# Patient Record
Sex: Male | Born: 1990 | Race: Black or African American | Hispanic: No | Marital: Single | Smoking: Never smoker
Health system: Southern US, Community
[De-identification: ages and names within clinical notes are randomized; demographics above are authoritative.]

## PROBLEM LIST (undated history)

## (undated) DIAGNOSIS — Z21 Asymptomatic human immunodeficiency virus [HIV] infection status: Secondary | ICD-10-CM

## (undated) DIAGNOSIS — G02 Meningitis in other infectious and parasitic diseases classified elsewhere: Secondary | ICD-10-CM

## (undated) DIAGNOSIS — B20 Human immunodeficiency virus [HIV] disease: Secondary | ICD-10-CM

---

## 2018-05-03 DIAGNOSIS — G02 Meningitis in other infectious and parasitic diseases classified elsewhere: Secondary | ICD-10-CM

## 2018-05-03 HISTORY — DX: Meningitis in other infectious and parasitic diseases classified elsewhere: G02

## 2018-09-03 ENCOUNTER — Encounter: Payer: Self-pay | Admitting: Emergency Medicine

## 2018-09-03 ENCOUNTER — Other Ambulatory Visit: Payer: Self-pay

## 2018-09-03 ENCOUNTER — Observation Stay
Admission: EM | Admit: 2018-09-03 | Discharge: 2018-09-05 | Disposition: A | Discharge status: Home / Self Care | Payer: Self-pay | Attending: Internal Medicine | Admitting: Internal Medicine

## 2018-09-03 DIAGNOSIS — B20 Human immunodeficiency virus [HIV] disease: Secondary | ICD-10-CM | POA: Insufficient documentation

## 2018-09-03 DIAGNOSIS — Z79899 Other long term (current) drug therapy: Secondary | ICD-10-CM | POA: Insufficient documentation

## 2018-09-03 DIAGNOSIS — G02 Meningitis in other infectious and parasitic diseases classified elsewhere: Secondary | ICD-10-CM | POA: Insufficient documentation

## 2018-09-03 DIAGNOSIS — B028 Zoster with other complications: Principal | ICD-10-CM | POA: Insufficient documentation

## 2018-09-03 DIAGNOSIS — B029 Zoster without complications: Secondary | ICD-10-CM | POA: Diagnosis present

## 2018-09-03 HISTORY — DX: Meningitis in other infectious and parasitic diseases classified elsewhere: G02

## 2018-09-03 HISTORY — DX: Asymptomatic human immunodeficiency virus (hiv) infection status: Z21

## 2018-09-03 HISTORY — DX: Human immunodeficiency virus (HIV) disease: B20

## 2018-09-03 MED ORDER — SODIUM CHLORIDE 0.9 % IV SOLN: 5.0000 mg/kg | Freq: Two times a day (BID) | INTRAVENOUS | Status: DC

## 2018-09-03 NOTE — ED Provider Notes (Signed)
[facility] Emergency Department Provider Note  ____________________________________________   First MD Initiated Contact with Patient 09/03/18 2322     (approximate)  I have reviewed the triage vital signs and the nursing notes.   HISTORY  Chief Complaint Rash   HPI David Lopez is a 27 y.o. male who self presents to the emergency department with 3 days of progressive painful itching rash across his left flank.  It initially began along his anterior left abdomen and seems to radiate back towards his mid back on the left.  His symptoms are now moderate severity.  He describes it as itching tingling and burning.  It does seem to radiate back and forth along the dermatome.  He has had no fevers or chills.  No cough.  No shortness of breath.  No double vision or blurred vision.  He does have a past medical history of HIV and has had a AIDS in the past.  Nothing seems to make his symptoms better or worse and they are clearly progressive.    History reviewed. No pertinent past medical history.  Patient Active Problem List   Diagnosis Date Noted  . Zoster 09/04/2018    History reviewed. No pertinent surgical history.  Prior to Admission medications   Medication Sig Start Date End Date Taking? Authorizing Provider  BIKTARVY 50-200-25 MG TABS tablet Take 1 tablet by mouth daily. 08/31/18  Yes [provider]  fluconazole (DIFLUCAN) 200 MG tablet Take 800 mg by mouth daily. 08/13/18  Yes [provider]    Allergies Patient has no known allergies.  No family history on file.  Social History Social History   Tobacco Use  . Smoking status: Never Smoker  . Smokeless tobacco: Never Used  Substance Use Topics  . Alcohol use: Never    Frequency: Never  . Drug use: Not on file    Review of Systems Constitutional: No fever/chills Eyes: No visual changes. ENT: No sore throat. Cardiovascular: Denies chest pain. Respiratory: Denies  shortness of breath. Gastrointestinal: No abdominal pain.  No nausea, no vomiting.  No diarrhea.  No constipation. Genitourinary: Negative for dysuria. Musculoskeletal: Negative for back pain. Skin: Positive for rash. Neurological: Negative for headaches, focal weakness or numbness.   ____________________________________________   PHYSICAL EXAM:  VITAL SIGNS: ED Triage Vitals  Enc Vitals Group     BP 09/03/18 2313 (!) 155/94     Pulse Rate 09/03/18 2313 87     Resp 09/03/18 2313 18     Temp 09/03/18 2313 98.7 F (37.1 C)     Temp Source 09/03/18 2313 Oral     SpO2 09/03/18 2313 100 %     Weight 09/03/18 2314 175 lb (79.4 kg)     Height 09/03/18 2314 5\' 11"  (1.803 m)     Head Circumference --      Peak Flow --      Pain Score 09/03/18 2314 0     Pain Loc --      Pain Edu? --      Excl. in GC? --     Constitutional: Alert and oriented x4 nontoxic no diaphoresis speaks in full clear sentences Eyes: PERRL EOMI. Head: Atraumatic. Nose: No congestion/rhinnorhea. Mouth/Throat: No trismus Neck: No stridor.   Cardiovascular: Normal rate, regular rhythm. Grossly normal heart sounds.  Good peripheral circulation. Respiratory: Normal respiratory effort.  No retractions. Lungs CTAB and moving good air Gastrointestinal: Soft nontender Musculoskeletal: No lower extremity edema   Neurologic:  Normal speech  and language. No gross focal neurologic deficits are appreciated. Skin: Single dermatomal vesicular rash along his left flank roughly T8 see photos below Psychiatric: Mood and affect are normal. Speech and behavior are normal.        ____________________________________________   DIFFERENTIAL includes but not limited to  Shingles, disseminated zoster, HIV, AIDS, viremia ____________________________________________   LABS (all labs ordered are listed, but only abnormal results are displayed)  Labs Reviewed  COMPREHENSIVE METABOLIC PANEL - Abnormal; Notable for the  following components:      Result Value   Creatinine, Ser 1.33 (*)    Total Protein 8.8 (*)    All other components within normal limits  CBC WITH DIFFERENTIAL/PLATELET - Abnormal; Notable for the following components:   WBC 2.4 (*)    RBC 3.49 (*)    Hemoglobin 11.1 (*)    HCT 35.2 (*)    MCV 100.9 (*)    Neutro Abs 1.0 (*)    All other components within normal limits  HELPER T-LYMPH-CD4 ([facility] ONLY)    Lab work reviewed by me shows low white count suggestive of ARDS __________________________________________  EKG   ____________________________________________  RADIOLOGY   ____________________________________________   PROCEDURES  Procedure(s) performed: no  Procedures  Critical Care performed: no  ____________________________________________   INITIAL IMPRESSION / ASSESSMENT AND PLAN / ED COURSE  Pertinent labs & imaging results that were available during my care of the patient were reviewed by me and considered in my medical decision making (see chart for details).   As part of my medical decision making, I reviewed the following data within the electronic MEDICAL RECORD NUMBER History obtained from family if available, nursing notes, old chart and ekg, as well as notes from prior ED visits.  The patient comes to the emergency department with a rash that is consistent with shingles.  I initially was planning on treating the patient as an outpatient as this seems to be in 1 dermatome and he is not systemically ill however on further questioning he does have a previous history of HIV along with AIDS and has been intermittently to noncompliant with his antiretrovirals.  While he does not appear systemically ill at this time I am concerned that he could have diffuse viremia and that this could rapidly spread and believe he requires IV acyclovir and inpatient admission.  I will give him 6 mg of IV morphine 4 mg of IV Zofran and 50 mg of IV Benadryl for pain nausea and itching  now.  I discussed with the hospitalist who has graciously agreed to admit the patient to his service.      ____________________________________________   FINAL CLINICAL IMPRESSION(S) / ED DIAGNOSES  Final diagnoses:  Herpes zoster with complication  Symptomatic HIV infection (HCC)      NEW MEDICATIONS STARTED DURING THIS VISIT:  Current Discharge Medication List       Note:  This document was prepared using Dragon voice recognition software and may include unintentional dictation errors.     Merrily Brittle, MD 09/04/18 [phone]

## 2018-09-03 NOTE — ED Triage Notes (Signed)
Patient to ER for c/o itchy painful rash to left side that patient reports sometimes has burning sensation. Patient has vesicular rash to left mid abd that travels to left back.

## 2018-09-04 ENCOUNTER — Encounter: Payer: Self-pay | Admitting: Internal Medicine

## 2018-09-04 DIAGNOSIS — B029 Zoster without complications: Secondary | ICD-10-CM | POA: Diagnosis present

## 2018-09-04 LAB — COMPREHENSIVE METABOLIC PANEL
ALT: 17 U/L (ref 0–44)
ANION GAP: 7 (ref 5–15)
AST: 21 U/L (ref 15–41)
Albumin: 4.1 g/dL (ref 3.5–5.0)
Alkaline Phosphatase: 119 U/L (ref 38–126)
BILIRUBIN TOTAL: 1.1 mg/dL (ref 0.3–1.2)
BUN: 13 mg/dL (ref 6–20)
CALCIUM: 9.5 mg/dL (ref 8.9–10.3)
CO2: 28 mmol/L (ref 22–32)
CREATININE: 1.33 mg/dL — AB (ref 0.61–1.24)
Chloride: 103 mmol/L (ref 98–111)
Glucose, Bld: 99 mg/dL (ref 70–99)
Potassium: 3.5 mmol/L (ref 3.5–5.1)
Sodium: 138 mmol/L (ref 135–145)
TOTAL PROTEIN: 8.8 g/dL — AB (ref 6.5–8.1)

## 2018-09-04 LAB — CBC WITH DIFFERENTIAL/PLATELET
Abs Immature Granulocytes: 0.03 10*3/uL (ref 0.00–0.07)
BASOS ABS: 0 10*3/uL (ref 0.0–0.1)
Basophils Relative: 1 %
Eosinophils Absolute: 0 10*3/uL (ref 0.0–0.5)
Eosinophils Relative: 1 %
HEMATOCRIT: 35.2 % — AB (ref 39.0–52.0)
HEMOGLOBIN: 11.1 g/dL — AB (ref 13.0–17.0)
IMMATURE GRANULOCYTES: 1 %
LYMPHS ABS: 0.8 10*3/uL (ref 0.7–4.0)
LYMPHS PCT: 35 %
MCH: 31.8 pg (ref 26.0–34.0)
MCHC: 31.5 g/dL (ref 30.0–36.0)
MCV: 100.9 fL — AB (ref 80.0–100.0)
MONOS PCT: 20 %
Monocytes Absolute: 0.5 10*3/uL (ref 0.1–1.0)
NRBC: 0 % (ref 0.0–0.2)
Neutro Abs: 1 10*3/uL — ABNORMAL LOW (ref 1.7–7.7)
Neutrophils Relative %: 42 %
Platelets: 245 10*3/uL (ref 150–400)
RBC: 3.49 MIL/uL — ABNORMAL LOW (ref 4.22–5.81)
RDW: 12.5 % (ref 11.5–15.5)
WBC: 2.4 10*3/uL — ABNORMAL LOW (ref 4.0–10.5)

## 2018-09-04 LAB — HEMOGLOBIN A1C
Hgb A1c MFr Bld: 4.5 % — ABNORMAL LOW (ref 4.8–5.6)
Mean Plasma Glucose: 82.45 mg/dL

## 2018-09-04 LAB — TSH: TSH: 2.715 u[IU]/mL (ref 0.350–4.500)

## 2018-09-04 MED ORDER — ACETAMINOPHEN 325 MG PO TABS
650.0000 mg | ORAL_TABLET | Freq: Four times a day (QID) | ORAL | Status: DC | PRN
  Administered 2018-09-04: 650 mg via ORAL
  Filled 2018-09-04: qty 2

## 2018-09-04 MED ORDER — DIPHENHYDRAMINE HCL 25 MG PO CAPS
25.0000 mg | ORAL_CAPSULE | Freq: Three times a day (TID) | ORAL | Status: DC | PRN
  Administered 2018-09-04: 25 mg via ORAL
  Filled 2018-09-04: qty 1

## 2018-09-04 MED ORDER — MORPHINE SULFATE (PF) 4 MG/ML IV SOLN
6.0000 mg | Freq: Once | INTRAVENOUS | Status: AC
  Administered 2018-09-04: 6 mg via INTRAVENOUS
  Filled 2018-09-04: qty 2

## 2018-09-04 MED ORDER — ONDANSETRON HCL 4 MG PO TABS: 4.0000 mg | ORAL_TABLET | Freq: Four times a day (QID) | ORAL | Status: DC | PRN

## 2018-09-04 MED ORDER — ONDANSETRON HCL 4 MG/2ML IJ SOLN: 4.0000 mg | Freq: Four times a day (QID) | INTRAMUSCULAR | Status: DC | PRN

## 2018-09-04 MED ORDER — ACETAMINOPHEN 650 MG RE SUPP: 650.0000 mg | Freq: Four times a day (QID) | RECTAL | Status: DC | PRN

## 2018-09-04 MED ORDER — DOCUSATE SODIUM 100 MG PO CAPS
100.0000 mg | ORAL_CAPSULE | Freq: Two times a day (BID) | ORAL | Status: DC
  Filled 2018-09-04: qty 1

## 2018-09-04 MED ORDER — ONDANSETRON HCL 4 MG/2ML IJ SOLN
4.0000 mg | Freq: Once | INTRAMUSCULAR | Status: AC
  Administered 2018-09-04: 4 mg via INTRAVENOUS
  Filled 2018-09-04: qty 2

## 2018-09-04 MED ORDER — DIPHENHYDRAMINE HCL 50 MG/ML IJ SOLN
50.0000 mg | Freq: Once | INTRAMUSCULAR | Status: AC
  Administered 2018-09-04: 50 mg via INTRAVENOUS
  Filled 2018-09-04: qty 1

## 2018-09-04 MED ORDER — FLUCONAZOLE 100 MG PO TABS
800.0000 mg | ORAL_TABLET | Freq: Every day | ORAL | Status: DC
  Administered 2018-09-04 – 2018-09-05 (×2): 800 mg via ORAL
  Filled 2018-09-04 (×2): qty 8

## 2018-09-04 MED ORDER — BICTEGRAVIR-EMTRICITAB-TENOFOV 50-200-25 MG PO TABS
1.0000 | ORAL_TABLET | Freq: Every day | ORAL | Status: DC
  Administered 2018-09-04 – 2018-09-05 (×2): 1 via ORAL
  Filled 2018-09-04 (×2): qty 1

## 2018-09-04 MED ORDER — ENOXAPARIN SODIUM 40 MG/0.4ML ~~LOC~~ SOLN
40.0000 mg | SUBCUTANEOUS | Status: DC
  Filled 2018-09-04: qty 0.4

## 2018-09-04 MED ORDER — DEXTROSE 5 % IV SOLN
10.0000 mg/kg | Freq: Three times a day (TID) | INTRAVENOUS | Status: DC
  Administered 2018-09-04 (×3): 795 mg via INTRAVENOUS
  Filled 2018-09-04 (×7): qty 15.9

## 2018-09-04 MED ORDER — OXYCODONE HCL 5 MG PO TABS: 5.0000 mg | ORAL_TABLET | Freq: Four times a day (QID) | ORAL | Status: DC | PRN

## 2018-09-04 NOTE — ED Notes (Addendum)
Floor unable to get report at this time. Nurse will call back to get report.

## 2018-09-04 NOTE — ED Notes (Signed)
Per Richardson Dopp, RN. Report given. Protected report time until 0730. Will transport to floor once able.

## 2018-09-04 NOTE — Plan of Care (Signed)
  Problem: Education: Goal: Knowledge of General Education information will improve Description Including pain rating scale, medication(s)/side effects and non-pharmacologic comfort measures Outcome: Progressing   Problem: Health Behavior/Discharge Planning: Goal: Ability to manage health-related needs will improve Outcome: Progressing   Problem: Clinical Measurements: Goal: Ability to maintain clinical measurements within normal limits will improve Outcome: Progressing Goal: Will remain free from infection Outcome: Progressing   Problem: Activity: Goal: Risk for activity intolerance will decrease Outcome: Progressing   Problem: Nutrition: Goal: Adequate nutrition will be maintained Outcome: Progressing   Problem: Coping: Goal: Level of anxiety will decrease Outcome: Progressing   Problem: Elimination: Goal: Will not experience complications related to bowel motility Outcome: Progressing   Problem: Pain Managment: Goal: General experience of comfort will improve Outcome: Progressing   Problem: Safety: Goal: Ability to remain free from injury will improve Outcome: Progressing   Problem: Skin Integrity: Goal: Risk for impaired skin integrity will decrease Outcome: Progressing   

## 2018-09-04 NOTE — ED Notes (Signed)
Floor notified of transport of patient to room. Onalee Hua [employer] fundraiser to transport. Report given by Richardson Dopp RN per shift report.

## 2018-09-04 NOTE — Progress Notes (Signed)
[employer] - [place] at [facility]                                                                                                                                                                                  Patient Demographics   David Lopez, is a 27 y.o. male, DOB - 1991-07-08, MVH:[id number]  Admit date - 09/03/2018   Admitting Physician Arnaldo Natal, MD  Outpatient Primary MD for the patient is Patient, No Pcp Per   LOS - 0  Subjective: Patient admitted with painful rash Appears to be herpetic rash in nature His pain is improved currently Review of Systems:   CONSTITUTIONAL: No documented fever. No fatigue, weakness. No weight gain, no weight loss.  EYES: No blurry or double vision.  ENT: No tinnitus. No postnasal drip. No redness of the oropharynx.  RESPIRATORY: No cough, no wheeze, no hemoptysis. No dyspnea.  CARDIOVASCULAR: No chest pain. No orthopnea. No palpitations. No syncope.  GASTROINTESTINAL: No nausea, no vomiting or diarrhea. No abdominal pain. No melena or hematochezia.  GENITOURINARY: No dysuria or hematuria.  ENDOCRINE: No polyuria or nocturia. No heat or cold intolerance.  HEMATOLOGY: No anemia. No bruising. No bleeding.  INTEGUMENTARY: Positive rashes. No lesions.  MUSCULOSKELETAL: No arthritis. No swelling. No gout.  NEUROLOGIC: No numbness, tingling, or ataxia. No seizure-type activity.  PSYCHIATRIC: No anxiety. No insomnia. No ADD.    Vitals:   Vitals:   09/04/18 0107 09/04/18 0130 09/04/18 0846 09/04/18 0939  BP: (!) 155/108 (!) 136/95 (!) 136/96   Pulse: 80 73 81   Resp: 18 18 20    Temp:   98.6 F (37 C)   TempSrc:   Oral   SpO2: 98% 98% (!) 88%   Weight:    62.2 kg  Height:    5\' 11"  (1.803 m)    Wt Readings from Last 3 Encounters:  09/04/18 62.2 kg     Intake/Output Summary (Last 24 hours) at 09/04/2018 1256 Last data filed at 09/04/2018 0409 Gross per 24 hour  Intake 181.53 ml  Output -  Net 181.53 ml     Physical Exam:   GENERAL: Pleasant-appearing in no apparent distress.  HEAD, EYES, EARS, NOSE AND THROAT: Atraumatic, normocephalic. Extraocular muscles are intact. Pupils equal and reactive to light. Sclerae anicteric. No conjunctival injection. No oro-pharyngeal erythema.  NECK: Supple. There is no jugular venous distention. No bruits, no lymphadenopathy, no thyromegaly.  HEART: Regular rate and rhythm,. No murmurs, no rubs, no clicks.  LUNGS: Clear to auscultation bilaterally. No rales or rhonchi. No wheezes.  ABDOMEN: Soft, flat, nontender, nondistended. Has good bowel sounds. No hepatosplenomegaly appreciated.  EXTREMITIES: No evidence of any  cyanosis, clubbing, or peripheral edema.  +2 pedal and radial pulses bilaterally.  NEUROLOGIC: The patient is alert, awake, and oriented x3 with no focal motor or sensory deficits appreciated bilaterally.  SKIN: Positive right thoracic and his flank and abdominal region.  Psych: Not anxious, depressed LN: No inguinal LN enlargement    Antibiotics   Anti-infectives (From admission, onward)   Start     Dose/Rate Route Frequency Ordered Stop   09/04/18 1000  bictegravir-emtricitabine-tenofovir AF (BIKTARVY) 50-200-25 MG per tablet 1 tablet     1 tablet Oral Daily 09/04/18 0824     09/04/18 1000  fluconazole (DIFLUCAN) tablet 800 mg     800 mg Oral Daily 09/04/18 0824     09/04/18 0015  acyclovir (ZOVIRAX) 795 mg in dextrose 5 % 150 mL IVPB     10 mg/kg  79.4 kg 165.9 mL/hr over 60 Minutes Intravenous Every 8 hours 09/04/18 0000     09/03/18 2345  ganciclovir (CYTOVENE) 395 mg in sodium chloride 0.9 % 100 mL IVPB  Status:  Discontinued     5 mg/kg  79.4 kg 100 mL/hr over 60 Minutes Intravenous Every 12 hours 09/03/18 2335 09/03/18 2358      Medications   Scheduled Meds: . bictegravir-emtricitabine-tenofovir AF  1 tablet Oral Daily  . docusate sodium  100 mg Oral BID  . enoxaparin (LOVENOX) injection  40 mg Subcutaneous Q24H  .  fluconazole  800 mg Oral Daily   Continuous Infusions: . acyclovir Stopped (09/04/18 0409)   PRN Meds:.acetaminophen **OR** acetaminophen, ondansetron **OR** ondansetron (ZOFRAN) IV, oxyCODONE   Data Review:   Micro Results No results found for this or any previous visit (from the past 240 hour(s)).  Radiology Reports No results found.   CBC Recent Labs  Lab 09/04/18 0025  WBC 2.4*  HGB 11.1*  HCT 35.2*  PLT 245  MCV 100.9*  MCH 31.8  MCHC 31.5  RDW 12.5  LYMPHSABS 0.8  MONOABS 0.5  EOSABS 0.0  BASOSABS 0.0    Chemistries  Recent Labs  Lab 09/04/18 0025  NA 138  K 3.5  CL 103  CO2 28  GLUCOSE 99  BUN 13  CREATININE 1.33*  CALCIUM 9.5  AST 21  ALT 17  ALKPHOS 119  BILITOT 1.1   ------------------------------------------------------------------------------------------------------------------ estimated creatinine clearance is 74.7 mL/min (A) (by C-G formula based on SCr of 1.33 mg/dL (H)). ------------------------------------------------------------------------------------------------------------------ No results for input(s): HGBA1C in the last 72 hours. ------------------------------------------------------------------------------------------------------------------ No results for input(s): CHOL, HDL, LDLCALC, TRIG, CHOLHDL, LDLDIRECT in the last 72 hours. ------------------------------------------------------------------------------------------------------------------ Recent Labs    09/04/18 0025  TSH 2.715   ------------------------------------------------------------------------------------------------------------------ No results for input(s): VITAMINB12, FOLATE, FERRITIN, TIBC, IRON, RETICCTPCT in the last 72 hours.  Coagulation profile No results for input(s): INR, PROTIME in the last 168 hours.  No results for input(s): DDIMER in the last 72 hours.  Cardiac Enzymes No results for input(s): CKMB, TROPONINI, MYOGLOBIN in the last 168  hours.  Invalid input(s): CK ------------------------------------------------------------------------------------------------------------------ Invalid input(s): POCBNP    Assessment & Plan   This is a 27 year old male admitted for herpes zoster. 1.  Zoster: Painful.    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  HIV: Last CD4 count 11; recheck immune status.  He reports compliance with his meds.  Continue ART (bitcegravir, emtricitabine and tenofovir).  Patient will need to follow-up with his primary HIV  MD he states that he sees him every 3 months earlier appointment will be needed 4.  DVT prophylaxis: Lovenox 5.  GI prophylaxis: None     Code Status Orders  (From admission, onward)         Start     Ordered   09/04/18 0825  Full code  Continuous     09/04/18 0824        Code Status History    This patient has a current code status but no historical code status.           Consults none  DVT Prophylaxis  Lovenox  Lab Results  Component Value Date   PLT 245 09/04/2018     Time Spent in minutes   45 minutes from 11:40 AM to 12:25 PM  Greater than 50% of time spent in care coordination and counseling patient regarding the condition and plan of care.   Auburn Bilberry M.D on 09/04/2018 at 12:56 PM  Between 7am to 6pm - Pager - [phone]  After 6pm go to [email or web address] - Social research officer, government  [employer]   Office  [phone]

## 2018-09-04 NOTE — H&P (Signed)
David Lopez is an 27 y.o. male.   Chief Complaint: Rash HPI: The patient with past medical history of HIV/AIDS and recent hospitalization for fungal meningitis presents to the emergency department with a painful rash.  The patient states that he first noticed the rash 5 days ago but that it has become tender.  Now he states that it feels as if it is burning at times.  He denies fever, nausea, vomiting or diarrhea.  The rash began on his stomach and has worked its way around his left chest onto his back.  The patient was placed on droplet isolation and given acyclovir prior to the emergency department staff calling the hospitalist service for admission.  The patient began taking antiretroviral therapy in April 2019.  His mother does not know about his past medical history.  Past Medical History:  Diagnosis Date  . HIV (human immunodeficiency virus infection) ([place])   . 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 No pertinent surgical history. None  Family History  Problem Relation Age of Onset  . Diabetes Mellitus II Paternal Grandmother   . CAD Neg Hx    Social History:  reports that he has never smoked. He has never used smokeless tobacco. He reports that he does not drink alcohol. His drug history is not on file.  Allergies: No Known Allergies  Medications Prior to Admission  Medication Sig Dispense Refill  . BIKTARVY 50-200-25 MG TABS tablet Take 1 tablet by mouth daily.  5  . fluconazole (DIFLUCAN) 200 MG tablet Take 800 mg by mouth daily.  0    Results for orders placed or performed during the hospital encounter of 09/03/18 (from the past 48 hour(s))  Comprehensive metabolic panel     Status: Abnormal   Collection Time: 09/04/18 12:25 AM  Result Value Ref Range   Sodium 138 135 - 145 mmol/L   Potassium 3.5 3.5 - 5.1 mmol/L   Chloride 103 98 - 111 mmol/L   CO2 28 22 - 32 mmol/L   Glucose, Bld 99 70 - 99 mg/dL   BUN 13 6 - 20 mg/dL   Creatinine, Ser 1.33  (H) 0.61 - 1.24 mg/dL   Calcium 9.5 8.9 - 10.3 mg/dL   Total Protein 8.8 (H) 6.5 - 8.1 g/dL   Albumin 4.1 3.5 - 5.0 g/dL   AST 21 15 - 41 U/L   ALT 17 0 - 44 U/L   Alkaline Phosphatase 119 38 - 126 U/L   Total Bilirubin 1.1 0.3 - 1.2 mg/dL   GFR calc non Af Amer >60 >60 mL/min   GFR calc Af Amer >60 >60 mL/min    Comment: (NOTE) The eGFR has been calculated using the CKD EPI equation. This calculation has not been validated in all clinical situations. eGFR's persistently <60 mL/min signify possible Chronic Kidney Disease.    Anion gap 7 5 - 15    Comment: Performed at [facility], [address] [place], [place] [address]  CBC with Differential     Status: Abnormal   Collection Time: 09/04/18 12:25 AM  Result Value Ref Range   WBC 2.4 (L) 4.0 - 10.5 K/uL   RBC 3.49 (L) 4.22 - 5.81 MIL/uL   Hemoglobin 11.1 (L) 13.0 - 17.0 g/dL   HCT 35.2 (L) 39.0 - 52.0 %   MCV 100.9 (H) 80.0 - 100.0 fL   MCH 31.8 26.0 - 34.0 pg   MCHC 31.5 30.0 - 36.0 g/dL   RDW  12.5 11.5 - 15.5 %   Platelets 245 150 - 400 K/uL   nRBC 0.0 0.0 - 0.2 %   Neutrophils Relative % 42 %   Neutro Abs 1.0 (L) 1.7 - 7.7 K/uL   Lymphocytes Relative 35 %   Lymphs Abs 0.8 0.7 - 4.0 K/uL   Monocytes Relative 20 %   Monocytes Absolute 0.5 0.1 - 1.0 K/uL   Eosinophils Relative 1 %   Eosinophils Absolute 0.0 0.0 - 0.5 K/uL   Basophils Relative 1 %   Basophils Absolute 0.0 0.0 - 0.1 K/uL   Immature Granulocytes 1 %   Abs Immature Granulocytes 0.03 0.00 - 0.07 K/uL    Comment: Performed at [facility], [address] [place], [place] [address]   No results found.  Review of Systems  Constitutional: Negative for chills and fever.  HENT: Negative for sore throat and tinnitus.   Eyes: Negative for blurred vision and redness.  Respiratory: Negative for cough and shortness of breath.   Cardiovascular: Negative for chest pain, palpitations, orthopnea and PND.  Gastrointestinal: Negative for  abdominal pain, diarrhea, nausea and vomiting.  Genitourinary: Negative for dysuria, frequency and urgency.  Musculoskeletal: Negative for joint pain and myalgias.  Skin: Negative for rash.       No lesions  Neurological: Negative for speech change, focal weakness and weakness.  Endo/Heme/Allergies: Does not bruise/bleed easily.       No temperature intolerance  Psychiatric/Behavioral: Negative for depression and suicidal ideas.    Blood pressure (!) 136/95, pulse 73, temperature 98.7 F (37.1 C), temperature source Oral, resp. rate 18, height 5' 11"  (1.803 m), weight 79.4 kg, SpO2 98 %. Physical Exam  Vitals reviewed. Constitutional: He is oriented to person, place, and time. He appears well-developed and well-nourished. No distress.  HENT:  Head: Normocephalic and atraumatic.  Mouth/Throat: Oropharynx is clear and moist.  Eyes: Pupils are equal, round, and reactive to light. Conjunctivae and EOM are normal. No scleral icterus.  Neck: Normal range of motion. Neck supple. No JVD present. No tracheal deviation present. No thyromegaly present.  Cardiovascular: Normal rate, regular rhythm and normal heart sounds. Exam reveals no gallop and no friction rub.  No murmur heard. Respiratory: Effort normal and breath sounds normal. No respiratory distress. He has no wheezes.  GI: Soft. Bowel sounds are normal. He exhibits no distension. There is no tenderness.  Genitourinary:  Genitourinary Comments: Deferred  Musculoskeletal: Normal range of motion. He exhibits no edema.  Lymphadenopathy:    He has no cervical adenopathy.  Neurological: He is alert and oriented to person, place, and time. No cranial nerve deficit.  Skin: Skin is warm and dry. Rash noted. There is erythema.  Psychiatric: He has a normal mood and affect. His behavior is normal. Judgment and thought content normal.     Assessment/Plan This is a 27 year old male admitted for herpes zoster. 1.  Zoster: Painful.  Manage severe  pain with IV morphine as needed.  Continue acyclovir.  Transition to oral anti-herpetic medication when it is clear the patient does not have disseminated disease.  At this time he does not have neurologic deficits nor does he have fever or rash outside of the dermatome of his left chest.  No evidence of immune reconstitution syndrome at this time. 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  HIV: Last CD4 count 11; recheck immune status.  He reports compliance with his meds.  Continue ART (bitcegravir, emtricitabine and tenofovir). 4.  DVT  prophylaxis: Lovenox 5.  GI prophylaxis: None The patient is a full code.  Time spent on admission orders and patient care approximately 45 minutes  Harrie Foreman, MD 09/04/2018, 8:15 AM

## 2018-09-04 NOTE — ED Notes (Addendum)
Floor stated that okay to send patient after shift change at 0730.

## 2018-09-05 LAB — BASIC METABOLIC PANEL
Anion gap: 7 (ref 5–15)
BUN: 15 mg/dL (ref 6–20)
CHLORIDE: 103 mmol/L (ref 98–111)
CO2: 26 mmol/L (ref 22–32)
CREATININE: 1.41 mg/dL — AB (ref 0.61–1.24)
Calcium: 9.5 mg/dL (ref 8.9–10.3)
GFR calc Af Amer: >60 mL/min (ref >60)
GFR calc non Af Amer: >60 mL/min (ref >60)
GLUCOSE: 92 mg/dL (ref 70–99)
POTASSIUM: 4.2 mmol/L (ref 3.5–5.1)
SODIUM: 136 mmol/L (ref 135–145)

## 2018-09-05 LAB — CBC
HEMATOCRIT: 33.4 % — AB (ref 39.0–52.0)
HEMOGLOBIN: 10.6 g/dL — AB (ref 13.0–17.0)
MCH: 31.9 pg (ref 26.0–34.0)
MCHC: 31.7 g/dL (ref 30.0–36.0)
MCV: 100.6 fL — AB (ref 80.0–100.0)
Platelets: 215 10*3/uL (ref 150–400)
RBC: 3.32 MIL/uL — AB (ref 4.22–5.81)
RDW: 12.5 % (ref 11.5–15.5)
WBC: 3.1 10*3/uL — ABNORMAL LOW (ref 4.0–10.5)
nRBC: 0 % (ref 0.0–0.2)

## 2018-09-05 MED ORDER — ACETAMINOPHEN 325 MG PO TABS: 650.0000 mg | ORAL_TABLET | Freq: Four times a day (QID) | ORAL | Status: DC | PRN

## 2018-09-05 MED ORDER — ACYCLOVIR 400 MG PO TABS: 800.0000 mg | ORAL_TABLET | Freq: Every day | ORAL | 0 refills | Status: AC

## 2018-09-05 MED ORDER — SODIUM CHLORIDE 0.9 % IV SOLN: INTRAVENOUS | Status: DC | PRN

## 2018-09-05 NOTE — Progress Notes (Signed)
[employer] - [place] at [facility] was admitted to the Hospital on 09/03/2018 and Discharged  09/05/2018 and should be excused from work/school   for 5 days starting 09/03/2018 , may return to work/school without any restrictions.  Call Auburn Bilberry MD with questions.  Auburn Bilberry M.D on 09/05/2018,at 10:52 AM  [employer] - [place] at [facility]  [phone]

## 2018-09-05 NOTE — Progress Notes (Signed)
Patient is being discharged to home with family. DC & Rx instructions given and patient acknowledged understanding. IV removed

## 2018-09-05 NOTE — Discharge Summary (Signed)
[facility] -  at [facility], [place] y.o., DOB 10/28/1991, MRN [id number]. Admission date: 09/03/2018 Discharge Date 09/05/2018 Primary MD Patient, No Pcp Per Admitting Physician Arnaldo Natal, MD  Admission Diagnosis  Symptomatic HIV infection (HCC) [B20] Herpes zoster with complication [B02.8]  Discharge Diagnosis   Active Problems: T10 dermatome herpes zoster Recent diagnosis of fungal meningitis HIV     Hospital Course  Patient is 27 year old who is [place] who was recently hospitalized with fungal meningitis who presented with a rash on his abdominal and back area.  He was noted to have herpetic zoster lesion.  You are you due to his HIV there was concern for may be dissemination therefore he was admitted for further evaluation and treatment.  Patient was treated with IV acyclovir.  There was no evidence of dissemination he is doing much better patient's pain is now resolving stable for discharge.  He will need to follow-up with ID physician as outpatient.           Consults  None  Significant Tests:  See full reports for all details     No results found.     Today   Subjective:   David Lopez patient doing better denies any complaints Objective:   Blood pressure (!) 136/96, pulse 81, temperature (!) 101.4 F (38.6 C), temperature source Oral, resp. rate 20, height 5\' 11"  (1.803 m), weight 62.4 kg, SpO2 (!) 88 %.  .  Intake/Output Summary (Last 24 hours) at 09/05/2018 1202 Last data filed at 09/05/2018 0500 Gross per 24 hour  Intake 332 ml  Output -  Net 332 ml    Exam VITAL SIGNS: Blood pressure (!) 136/96, pulse 81, temperature (!) 101.4 F (38.6 C), temperature source Oral, resp. rate 20, height 5\' 11"  (1.803 m), weight 62.4 kg, SpO2 (!) 88 %.  GENERAL:  27 y.o.-year-old patient lying in the bed with no acute distress.  EYES: Pupils equal, round, reactive to light and  accommodation. No scleral icterus. Extraocular muscles intact.  HEENT: Head atraumatic, normocephalic. Oropharynx and nasopharynx clear.  NECK:  Supple, no jugular venous distention. No thyroid enlargement, no tenderness.  LUNGS: Normal breath sounds bilaterally, no wheezing, rales,rhonchi or crepitation. No use of accessory muscles of respiration.  CARDIOVASCULAR: S1, S2 normal. No murmurs, rubs, or gallops.  ABDOMEN: Soft, nontender, nondistended. Bowel sounds present. No organomegaly or mass.  EXTREMITIES: No pedal edema, cyanosis, or clubbing.  NEUROLOGIC: Cranial nerves II through XII are intact. Muscle strength 5/5 in all extremities. Sensation intact. Gait not checked.  PSYCHIATRIC: The patient is alert and oriented x 3.  SKIN: T10 distribution rash consistent with zoster  Data Review     CBC w Diff:  Lab Results  Component Value Date   WBC 3.1 (L) 09/05/2018   HGB 10.6 (L) 09/05/2018   HCT 33.4 (L) 09/05/2018   PLT 215 09/05/2018   LYMPHOPCT 35 09/04/2018   MONOPCT 20 09/04/2018   EOSPCT 1 09/04/2018   BASOPCT 1 09/04/2018   CMP:  Lab Results  Component Value Date   NA 136 09/05/2018   K 4.2 09/05/2018   CL 103 09/05/2018   CO2 26 09/05/2018   BUN 15 09/05/2018   CREATININE 1.41 (H) 09/05/2018   PROT 8.8 (H) 09/04/2018   ALBUMIN 4.1 09/04/2018   BILITOT 1.1 09/04/2018   ALKPHOS 119 09/04/2018   AST 21 09/04/2018   ALT 17 09/04/2018  .  Micro Results  No results found for this or any previous visit (from the past 240 hour(s)).      Code Status Orders  (From admission, onward)         Start     Ordered   09/04/18 0825  Full code  Continuous     09/04/18 0824        Code Status History    This patient has a current code status but no historical code status.            Discharge Medications   Allergies as of 09/05/2018   No Known Allergies     Medication List    TAKE these medications   acetaminophen 325 MG tablet Commonly known as:   TYLENOL Take 2 tablets (650 mg total) by mouth every 6 (six) hours as needed for mild pain (or Fever >/= 101).   acyclovir 400 MG tablet Commonly known as:  ZOVIRAX Take 2 tablets (800 mg total) by mouth 5 (five) times daily for 7 days.   BIKTARVY 50-200-25 MG Tabs tablet Generic drug:  bictegravir-emtricitabine-tenofovir AF Take 1 tablet by mouth daily.   fluconazole 200 MG tablet Commonly known as:  DIFLUCAN Take 800 mg by mouth daily.          Total Time in preparing paper work, data evaluation and todays exam - 35 minutes  Auburn Bilberry M.D on 09/05/2018 at 12:02 PM [facility]   Office  [phone]

## 2018-09-06 LAB — HELPER T-LYMPH-CD4 (ARMC ONLY)
% CD 4 Pos. Lymph.: 18.8 % — ABNORMAL LOW (ref 30.8–58.5)
Absolute CD 4 Helper: 150 /uL — ABNORMAL LOW (ref 359–1519)
BASOS ABS: 0 10*3/uL (ref 0.0–0.2)
BASOS: 1 %
EOS (ABSOLUTE): 0 10*3/uL (ref 0.0–0.4)
EOS: 1 %
HEMATOCRIT: 33.6 % — AB (ref 37.5–51.0)
HEMOGLOBIN: 11 g/dL — AB (ref 13.0–17.7)
Immature Grans (Abs): 0 10*3/uL (ref 0.0–0.1)
Immature Granulocytes: 1 %
LYMPHS ABS: 0.8 10*3/uL (ref 0.7–3.1)
Lymphs: 39 %
MCH: 32 pg (ref 26.6–33.0)
MCHC: 32.7 g/dL (ref 31.5–35.7)
MCV: 98 fL — ABNORMAL HIGH (ref 79–97)
MONOS ABS: 0.4 10*3/uL (ref 0.1–0.9)
Monocytes: 20 %
NEUTROS ABS: 0.8 10*3/uL — AB (ref 1.4–7.0)
Neutrophils: 38 %
PLATELETS: 190 10*3/uL (ref 150–450)
RBC: 3.44 x10E6/uL — AB (ref 4.14–5.80)
RDW: 12 % — ABNORMAL LOW (ref 12.3–15.4)
WBC: 2.1 10*3/uL — AB (ref 3.4–10.8)

## 2019-02-07 ENCOUNTER — Emergency Department: Payer: Self-pay

## 2019-02-07 ENCOUNTER — Other Ambulatory Visit: Payer: Self-pay

## 2019-02-07 ENCOUNTER — Encounter: Payer: Self-pay | Admitting: Emergency Medicine

## 2019-02-07 ENCOUNTER — Inpatient Hospital Stay
Admission: EM | Admit: 2019-02-07 | Discharge: 2019-02-09 | DRG: 974 | Disposition: A | Discharge status: Home / Self Care | Payer: Self-pay | Attending: Internal Medicine | Admitting: Internal Medicine

## 2019-02-07 DIAGNOSIS — B451 Cerebral cryptococcosis: Secondary | ICD-10-CM | POA: Diagnosis present

## 2019-02-07 DIAGNOSIS — F1290 Cannabis use, unspecified, uncomplicated: Secondary | ICD-10-CM | POA: Diagnosis present

## 2019-02-07 DIAGNOSIS — N183 Chronic kidney disease, stage 3 unspecified: Secondary | ICD-10-CM | POA: Diagnosis present

## 2019-02-07 DIAGNOSIS — A419 Sepsis, unspecified organism: Secondary | ICD-10-CM | POA: Diagnosis present

## 2019-02-07 DIAGNOSIS — B20 Human immunodeficiency virus [HIV] disease: Secondary | ICD-10-CM | POA: Diagnosis present

## 2019-02-07 DIAGNOSIS — G039 Meningitis, unspecified: Secondary | ICD-10-CM | POA: Diagnosis present

## 2019-02-07 DIAGNOSIS — R7881 Bacteremia: Secondary | ICD-10-CM

## 2019-02-07 DIAGNOSIS — Z21 Asymptomatic human immunodeficiency virus [HIV] infection status: Secondary | ICD-10-CM | POA: Diagnosis present

## 2019-02-07 DIAGNOSIS — Z8661 Personal history of infections of the central nervous system: Secondary | ICD-10-CM

## 2019-02-07 DIAGNOSIS — A4151 Sepsis due to Escherichia coli [E. coli]: Principal | ICD-10-CM | POA: Diagnosis present

## 2019-02-07 DIAGNOSIS — Z79899 Other long term (current) drug therapy: Secondary | ICD-10-CM

## 2019-02-07 LAB — COMPREHENSIVE METABOLIC PANEL
ALT: 19 U/L (ref 0–44)
AST: 30 U/L (ref 15–41)
Albumin: 4.3 g/dL (ref 3.5–5.0)
Alkaline Phosphatase: 83 U/L (ref 38–126)
Anion gap: 11 (ref 5–15)
BUN: 27 mg/dL — ABNORMAL HIGH (ref 6–20)
CO2: 22 mmol/L (ref 22–32)
Calcium: 9 mg/dL (ref 8.9–10.3)
Chloride: 107 mmol/L (ref 98–111)
Creatinine, Ser: 1.65 mg/dL — ABNORMAL HIGH (ref 0.61–1.24)
GFR calc Af Amer: >60 mL/min (ref >60)
GFR calc non Af Amer: 57 mL/min — ABNORMAL LOW (ref >60)
Glucose, Bld: 129 mg/dL — ABNORMAL HIGH (ref 70–99)
Potassium: 3.2 mmol/L — ABNORMAL LOW (ref 3.5–5.1)
Sodium: 140 mmol/L (ref 135–145)
Total Bilirubin: 3.2 mg/dL — ABNORMAL HIGH (ref 0.3–1.2)
Total Protein: 8.9 g/dL — ABNORMAL HIGH (ref 6.5–8.1)

## 2019-02-07 LAB — CSF CELL COUNT WITH DIFFERENTIAL
Eosinophils, CSF: 0 %
Eosinophils, CSF: 0 %
Lymphs, CSF: 92 %
Lymphs, CSF: 88 %
Monocyte-Macrophage-Spinal Fluid: 8 %
Monocyte-Macrophage-Spinal Fluid: 12 %
RBC Count, CSF: 7 /mm3 — ABNORMAL HIGH (ref 0–3)
RBC Count, CSF: 5 /mm3 — ABNORMAL HIGH (ref 0–3)
Segmented Neutrophils-CSF: 0 %
Segmented Neutrophils-CSF: 0 %
Tube #: 2
WBC, CSF: 14 /mm3 (ref 0–5)
WBC, CSF: 14 /mm3 (ref 0–5)

## 2019-02-07 LAB — LACTIC ACID, PLASMA
Lactic Acid, Venous: 1.6 mmol/L (ref 0.5–1.9)
Lactic Acid, Venous: 1 mmol/L (ref 0.5–1.9)

## 2019-02-07 LAB — CBC WITH DIFFERENTIAL/PLATELET
Abs Immature Granulocytes: 0.09 10*3/uL — ABNORMAL HIGH (ref 0.00–0.07)
Basophils Absolute: 0 10*3/uL (ref 0.0–0.1)
Basophils Relative: 0 %
Eosinophils Absolute: 0 10*3/uL (ref 0.0–0.5)
Eosinophils Relative: 0 %
HCT: 35.5 % — ABNORMAL LOW (ref 39.0–52.0)
Hemoglobin: 10.9 g/dL — ABNORMAL LOW (ref 13.0–17.0)
Immature Granulocytes: 1 %
Lymphocytes Relative: 10 %
Lymphs Abs: 1.5 10*3/uL (ref 0.7–4.0)
MCH: 31.6 pg (ref 26.0–34.0)
MCHC: 30.7 g/dL (ref 30.0–36.0)
MCV: 102.9 fL — ABNORMAL HIGH (ref 80.0–100.0)
Monocytes Absolute: 1.2 10*3/uL — ABNORMAL HIGH (ref 0.1–1.0)
Monocytes Relative: 8 %
Neutro Abs: 12.8 10*3/uL — ABNORMAL HIGH (ref 1.7–7.7)
Neutrophils Relative %: 81 %
Platelets: 150 10*3/uL (ref 150–400)
RBC: 3.45 MIL/uL — ABNORMAL LOW (ref 4.22–5.81)
RDW: 12.2 % (ref 11.5–15.5)
WBC: 15.6 10*3/uL — ABNORMAL HIGH (ref 4.0–10.5)
nRBC: 0 % (ref 0.0–0.2)

## 2019-02-07 LAB — GLUCOSE, CSF: Glucose, CSF: 70 mg/dL (ref 40–70)

## 2019-02-07 LAB — PROTEIN, CSF: Total  Protein, CSF: 29 mg/dL (ref 15–45)

## 2019-02-07 MED ORDER — SODIUM CHLORIDE 0.9 % IV SOLN
2.0000 g | Freq: Three times a day (TID) | INTRAVENOUS | Status: DC
  Administered 2019-02-08: 2 g via INTRAVENOUS
  Filled 2019-02-07 (×4): qty 2

## 2019-02-07 MED ORDER — VANCOMYCIN HCL 500 MG IV SOLR
500.0000 mg | Freq: Once | INTRAVENOUS | Status: AC
  Administered 2019-02-07: 23:00:00 500 mg via INTRAVENOUS
  Filled 2019-02-07: qty 500

## 2019-02-07 MED ORDER — VANCOMYCIN HCL IN DEXTROSE 1-5 GM/200ML-% IV SOLN
1000.0000 mg | Freq: Once | INTRAVENOUS | Status: AC
  Administered 2019-02-07: 1000 mg via INTRAVENOUS
  Filled 2019-02-07: qty 200

## 2019-02-07 MED ORDER — LORAZEPAM 2 MG/ML IJ SOLN
0.5000 mg | Freq: Once | INTRAMUSCULAR | Status: AC
  Administered 2019-02-07: 19:00:00 0.5 mg via INTRAVENOUS
  Filled 2019-02-07: qty 1

## 2019-02-07 MED ORDER — SODIUM CHLORIDE 0.9 % IV SOLN
1.0000 g | Freq: Once | INTRAVENOUS | Status: AC
  Administered 2019-02-07: 19:00:00 1 g via INTRAVENOUS
  Filled 2019-02-07: qty 10

## 2019-02-07 MED ORDER — ONDANSETRON HCL 4 MG/2ML IJ SOLN
4.0000 mg | Freq: Once | INTRAMUSCULAR | Status: AC
  Administered 2019-02-07: 4 mg via INTRAVENOUS
  Filled 2019-02-07: qty 2

## 2019-02-07 MED ORDER — SODIUM CHLORIDE 0.9 % IV BOLUS
1000.0000 mL | Freq: Once | INTRAVENOUS | Status: AC
  Administered 2019-02-07: 1000 mL via INTRAVENOUS

## 2019-02-07 MED ORDER — ONDANSETRON HCL 4 MG/2ML IJ SOLN
4.0000 mg | Freq: Once | INTRAMUSCULAR | Status: AC
  Administered 2019-02-07: 4 mg via INTRAVENOUS

## 2019-02-07 MED ORDER — VANCOMYCIN HCL IN DEXTROSE 750-5 MG/150ML-% IV SOLN
750.0000 mg | Freq: Two times a day (BID) | INTRAVENOUS | Status: DC
  Administered 2019-02-08: 750 mg via INTRAVENOUS
  Filled 2019-02-07 (×3): qty 150

## 2019-02-07 MED ORDER — LEVOFLOXACIN IN D5W 500 MG/100ML IV SOLN
500.0000 mg | Freq: Once | INTRAVENOUS | Status: AC
  Administered 2019-02-07: 18:00:00 500 mg via INTRAVENOUS
  Filled 2019-02-07: qty 100

## 2019-02-07 MED ORDER — ACETAMINOPHEN 325 MG PO TABS
650.0000 mg | ORAL_TABLET | Freq: Four times a day (QID) | ORAL | Status: DC | PRN
  Administered 2019-02-07 – 2019-02-08 (×2): 650 mg via ORAL
  Filled 2019-02-07 (×2): qty 2

## 2019-02-07 MED ORDER — HYDROMORPHONE HCL 1 MG/ML IJ SOLN
0.5000 mg | Freq: Once | INTRAMUSCULAR | Status: AC
  Administered 2019-02-07: 19:00:00 0.5 mg via INTRAVENOUS
  Filled 2019-02-07: qty 1

## 2019-02-07 NOTE — Progress Notes (Signed)
Pharmacy Antibiotic Note  David Lopez is a 28 y.o. male admitted on 02/07/2019 with sepsis.  Pharmacy has been consulted for vanc/cefepime dosing. 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  Plan: Patient received vanc 1g, ceftriaxone 1g, and levaquin 500 mg IV x 1. Will give an additional vanc 500 mg IV x 1 to complete a 1.5g IV load  Vancomycin 750 mg IV Q 12 hrs. Goal AUC 400-550. Expected AUC: 505.8 SCr used: 1.65  Cssmin: 14.9 Will start cefepime 2g IV q8h and will monitor s/sx of infx and resolution, and will adjust abx doses per renal function.  Height: 5\' 11"  (180.3 cm) Weight: 140 lb (63.5 kg) IBW/kg (Calculated) : 75.3  Temp (24hrs), Avg:100.8 F (38.2 C), Min:98.6 F (37 C), Max:103 F (39.4 C)  Recent Labs  Lab 02/07/19 1742 02/07/19 1750 02/07/19 2024  WBC 15.6*  --   --   CREATININE 1.65*  --   --   LATICACIDVEN  --  1.6 1.0    Estimated Creatinine Clearance: 61.5 mL/min (A) (by C-G formula based on SCr of 1.65 mg/dL (H)).    No Known Allergies  Thank you for allowing pharmacy to be a part of this patient's care.  Thomasene Ripple, PharmD, BCPS Clinical Pharmacist 02/07/2019

## 2019-02-07 NOTE — ED Triage Notes (Signed)
Per pt non stop vomiting since yesterday evening with fevers up to 102. No diarrhea. No cough. 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 Pt given mask. Has not been taking medications per mom.  Mom reports he c/o severe headache last night but pt denies.

## 2019-02-07 NOTE — ED Notes (Signed)
Patient transported to X-ray 

## 2019-02-07 NOTE — H&P (Signed)
[facility] - [place] at [facility]   PATIENT NAME: David HaskellDewayne Lopez    MR#:  [id number]  DATE OF BIRTH:  07/16/1991  DATE OF ADMISSION:  02/07/2019  PRIMARY CARE PHYSICIAN: Patient, No Pcp Per   REQUESTING/REFERRING PHYSICIAN: Darnelle CatalanMalinda, MD  CHIEF COMPLAINT:   Chief Complaint  Patient presents with  . Emesis  . Fever    HISTORY OF PRESENT ILLNESS:  David Lopez  is a 28 y.o. male who presents with chief complaint as above.  Patient states he has had nausea with vomiting for the past 3 days.  He is also had what he believes is intermittent fever.  He presented to the ED today with documented fever, tachycardia, leukocytosis.  He has an HIV patient and states that for the past 2 months he is been unable to obtain any medication, including heart therapy.  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  He was also seen in our hospital at the end of last year with a suspicion for herpes zoster.  He meets sepsis criteria in our ED today, and hospitalist were called for admission  PAST MEDICAL HISTORY:   Past Medical History:  Diagnosis Date  . HIV (human immunodeficiency virus infection) (HCC)   . Meningitis due to fungal infection 05/2018     PAST SURGICAL HISTORY:  No past surgeries   SOCIAL HISTORY:   Social History   Tobacco Use  . Smoking status: Never Smoker  . Smokeless tobacco: Never Used  Substance Use Topics  . Alcohol use: Never    Frequency: Never     FAMILY HISTORY:   Family History  Problem Relation Age of Onset  . Diabetes Mellitus II Paternal Grandmother   . CAD Neg Hx      DRUG ALLERGIES:  No Known Allergies  MEDICATIONS AT HOME:   Prior to Admission medications   Medication Sig Start Date End Date Taking? Authorizing Provider  acetaminophen (TYLENOL) 325 MG tablet Take 2 tablets (650 mg total) by mouth every 6 (six) hours as needed  for mild pain (or Fever >/= 101). 09/05/18   Auburn BilberryPatel, Shreyang, MD  BIKTARVY 50-200-25 MG TABS tablet Take 1 tablet by mouth daily. 08/31/18   [provider]  fluconazole (DIFLUCAN) 200 MG tablet Take 800 mg by mouth daily. 08/13/18   [provider]    REVIEW OF SYSTEMS:  Review of Systems  Constitutional: Positive for fever and malaise/fatigue. Negative for chills and weight loss.  HENT: Negative for ear pain, hearing loss and tinnitus.   Eyes: Negative for blurred vision, double vision, pain and redness.  Respiratory: Negative for cough, hemoptysis and shortness of breath.   Cardiovascular: Negative for chest pain, palpitations, orthopnea and leg swelling.  Gastrointestinal: Positive for nausea and vomiting. Negative for abdominal pain, constipation and diarrhea.  Genitourinary: Negative for dysuria, frequency and hematuria.  Musculoskeletal: Negative for back pain, joint pain and neck pain.  Skin:       No acne, rash, or lesions  Neurological: Negative for dizziness, tremors, focal weakness and weakness.  Endo/Heme/Allergies: Negative for polydipsia. Does not bruise/bleed easily.  Psychiatric/Behavioral: Negative for depression. The patient is not nervous/anxious and does not have insomnia.      VITAL SIGNS:   Vitals:   02/07/19 1930 02/07/19 2013 02/07/19 2030 02/07/19 2100  BP:  (!) 93/58 116/78 115/80  Pulse: 97 92 91 81  Resp:  20 12 (!) 24  Temp:  98.6 F (37 C)    TempSrc:  Oral    SpO2: 96% 95% 98% 98%  Weight:      Height:       Wt Readings from Last 3 Encounters:  02/07/19 63.5 kg  09/05/18 62.4 kg    PHYSICAL EXAMINATION:  Physical Exam  Vitals reviewed. Constitutional: He is oriented to person, place, and time. He appears well-developed and well-nourished. No distress.  HENT:  Head: Normocephalic and atraumatic.  Dry mucous membranes  Eyes: Pupils are equal, round, and reactive to light. Conjunctivae and EOM are normal. No scleral  icterus.  Neck: Normal range of motion. Neck supple. No JVD present. No thyromegaly present.  Cardiovascular: Regular rhythm and intact distal pulses. Exam reveals no gallop and no friction rub.  No murmur heard. Tachycardic  Respiratory: Effort normal and breath sounds normal. No respiratory distress. He has no wheezes. He has no rales.  GI: Soft. Bowel sounds are normal. He exhibits no distension. There is abdominal tenderness.  Musculoskeletal: Normal range of motion.        General: No edema.     Comments: No arthritis, no gout  Lymphadenopathy:    He has no cervical adenopathy.  Neurological: He is alert and oriented to person, place, and time. No cranial nerve deficit.  No dysarthria, no aphasia  Skin: Skin is warm and dry. No rash noted. No erythema.  Psychiatric: He has a normal mood and affect. His behavior is normal. Judgment and thought content normal.    LABORATORY PANEL:   CBC Recent Labs  Lab 02/07/19 1742  WBC 15.6*  HGB 10.9*  HCT 35.5*  PLT 150   ------------------------------------------------------------------------------------------------------------------  Chemistries  Recent Labs  Lab 02/07/19 1742  NA 140  K 3.2*  CL 107  CO2 22  GLUCOSE 129*  BUN 27*  CREATININE 1.65*  CALCIUM 9.0  AST 30  ALT 19  ALKPHOS 83  BILITOT 3.2*   ------------------------------------------------------------------------------------------------------------------  Cardiac Enzymes No results for input(s): TROPONINI in the last 168 hours. ------------------------------------------------------------------------------------------------------------------  RADIOLOGY:  Dg Chest 2 View  Result Date: 02/07/2019 CLINICAL DATA:  Fever EXAM: CHEST - 2 VIEW COMPARISON:  None. FINDINGS: Lungs are clear. Heart size and pulmonary vascularity are normal. No adenopathy. No bone lesions. IMPRESSION: No edema or consolidation. Electronically Signed   By: Bretta Bang III M.D.    On: 02/07/2019 18:35   Ct Head Wo Contrast  Result Date: 02/07/2019 CLINICAL DATA:  Headache and vomiting since yesterday with associated fevers. History of fungal meningitis. EXAM: CT HEAD WITHOUT CONTRAST TECHNIQUE: Contiguous axial images were obtained from the base of the skull through the vertex without intravenous contrast. COMPARISON:  None. FINDINGS: Brain: The brain shows a normal appearance without evidence of malformation, atrophy, old or acute small or large vessel infarction, mass lesion, hemorrhage, hydrocephalus or extra-axial collection. Vascular: No hyperdense vessel. No evidence of atherosclerotic calcification. Skull: Normal.  No traumatic finding.  No focal bone lesion. Sinuses/Orbits: Sinuses are clear. Orbits appear normal. Mastoids are clear. Other: None significant IMPRESSION: Normal head CT Electronically Signed   By: Paulina Fusi M.D.   On: 02/07/2019 18:25    EKG:   Orders placed or performed during the hospital encounter of 02/07/19  . ED EKG 12-Lead  . ED EKG 12-Lead  . EKG 12-Lead  . EKG 12-Lead    IMPRESSION AND PLAN:  Principal Problem:   Sepsis (HCC) -unclear absolute etiology, though strong concern for possibility of meningitis.  Patient  states he has not had any headaches, though his mother reported that he had been complaining of headaches.  He had an LP performed in the ED, with studies sent for HSV and cryptococcal antigen, as well as routine testing.  His lactic acid was within normal limits.  His blood pressure stable.  We will admit him with coverage upfront for bacterial, viral, cryptococcal infection.  We will get an ID consult for further recommendations and tailoring his therapy as his test results come back.  Given his fever and his immunocompromise state we have also sent a test for novel coronavirus Active Problems:   HIV (human immunodeficiency virus infection) (HCC) -ID consult for recommendation on [facility] therapy.  CD4 and viral load studies sent    Meningitis -work-up as above, coverage with antibiotics antifungal and antiviral for now, ID consult as above   CKD (chronic kidney disease), stage III (HCC) -right around baseline, hydrate with IV fluids and avoid nephrotoxins  Chart review performed and case discussed with ED provider. Labs, imaging and/or ECG reviewed by provider and discussed with patient/family. Management plans discussed with the patient and/or family.  DVT PROPHYLAXIS: SubQ lovenox   GI PROPHYLAXIS:  None  ADMISSION STATUS: Inpatient     CODE STATUS: Full Code Status History    Date Active Date Inactive Code Status Order ID Comments User Context   09/04/2018 0824 09/05/2018 1516 Full Code [id number]  Arnaldo Natal, MD Inpatient      TOTAL TIME TAKING CARE OF THIS PATIENT: 45 minutes.   Barney Drain 02/07/2019, 10:15 PM  [facility]  Office  [phone]  CC: Primary care physician; Patient, No Pcp Per  Note:  This document was prepared using Dragon voice recognition software and may include unintentional dictation errors.

## 2019-02-07 NOTE — ED Notes (Signed)
Provider at bedside for Spinal tap, informed consent obtained.

## 2019-02-07 NOTE — Progress Notes (Signed)
CODE SEPSIS - PHARMACY COMMUNICATION  **Broad Spectrum Antibiotics should be administered within 1 hour of Sepsis diagnosis**  Time Code Sepsis Called/Page Received: 1750  Antibiotics Ordered: Vancomycin, Levaquin, Rocephin  Time of 1st antibiotic administration: 1830   Nancy Arvin ,PharmD Clinical Pharmacist  02/07/2019  6:18 PM

## 2019-02-07 NOTE — ED Provider Notes (Addendum)
[facility] Emergency Department Provider Note   ____________________________________________   First MD Initiated Contact with Patient 02/07/19 1749     (approximate)  I have reviewed the triage vital signs and the nursing notes.   HISTORY  Chief Complaint Emesis and Fever    HPI David Lopez is a 28 y.o. 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  Comes in today with a history of a bad headache yesterday nausea and vomiting yesterday and today.  Today patient denies any headache eye pain neck stiffness or similar complaints.  He denies any coughing or abdominal pain.  He is vomiting however and has a fever 103.  He has an HIV history he has been unable to afford his medications for HIV or for his cryptococcal meningitis suppression.        Past Medical History:  Diagnosis Date  . HIV (human immunodeficiency virus infection) (HCC)   . 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 CKD (chronic kidney disease), stage III (HCC) 02/07/2019  . HIV (human immunodeficiency virus infection) (HCC) 02/07/2019  . Sepsis (HCC) 02/07/2019  . Meningitis 02/07/2019  . Zoster 09/04/2018    History reviewed. No pertinent surgical history.  Prior to Admission medications   Medication Sig Start Date End Date Taking? Authorizing Provider  acetaminophen (TYLENOL) 325 MG tablet Take 2 tablets (650 mg total) by mouth every 6 (six) hours as needed for mild pain (or Fever >/= 101). 09/05/18   Auburn BilberryPatel, Shreyang, MD  BIKTARVY 50-200-25 MG TABS tablet Take 1 tablet by mouth daily. 08/31/18   [provider]  fluconazole (DIFLUCAN) 200 MG tablet Take 800 mg by mouth daily. 08/13/18   [provider]    Allergies Patient has no known allergies.  Family History  Problem Relation Age of Onset  . Diabetes Mellitus II Paternal  Grandmother   . CAD Neg Hx     Social History Social History   Tobacco Use  . Smoking status: Never Smoker  . Smokeless tobacco: Never Used  Substance Use Topics  . Alcohol use: Never    Frequency: Never  . Drug use: Not on file    Review of Systems  Constitutional:  fever/chills Eyes: No visual changes. ENT: No sore throat. Cardiovascular: Denies chest pain. Respiratory: Denies shortness of breath. Gastrointestinal: No abdominal pain.   nausea, vomiting.  No diarrhea.  No constipation. Genitourinary: Negative for dysuria. Musculoskeletal: Negative for back pain. Skin: Negative for rash. Neurological: Negative for headaches at present, focal weakness or numbness.   ____________________________________________   PHYSICAL EXAM:  VITAL SIGNS: ED Triage Vitals  Enc Vitals Group     BP 02/07/19 1744 131/71     Pulse Rate 02/07/19 1744 (!) 117     Resp 02/07/19 1744 (!) 22     Temp 02/07/19 1744 (!) 103 F (39.4 C)     Temp Source 02/07/19 1744 Oral     SpO2 02/07/19 1744 97 %     Weight 02/07/19 1740 140 lb (63.5 kg)     Height 02/07/19 1740 5\' 11"  (1.803 m)     Head Circumference --      Peak Flow --      Pain Score 02/07/19 1740 0     Pain Loc --      Pain Edu? --      Excl. in GC? --  Constitutional: Alert and oriented. Well appearing and in no acute distress. Eyes: Conjunctivae are normal. PERRL. EOMI. there is somewhat difficult to see as patient will not hold his eyes still but he does not appear to have papilledema Head: Atraumatic. Nose: No congestion/rhinnorhea. Mouth/Throat: Mucous membranes are moist.  Oropharynx non-erythematous. Neck: No stridor.  Neck is supple Hematological/Lymphatic/Immunilogical: No cervical lymphadenopathy. Cardiovascular: Normal rate, regular rhythm. Grossly normal heart sounds.  Good peripheral circulation. Respiratory: Normal respiratory effort.  No retractions. Lungs CTAB. Gastrointestinal: Soft and nontender. No  distention. No abdominal bruits. No CVA tenderness. }Musculoskeletal: No lower extremity tenderness nor edema.   Neurologic:  Normal speech and language. No gross focal neurologic deficits are appreciated.  Skin:  Skin is warm, dry and intact. No rash noted. Psychiatric: Mood and affect are normal. Speech and behavior are normal.  ____________________________________________   LABS (all labs ordered are listed, but only abnormal results are displayed)  Labs Reviewed  COMPREHENSIVE METABOLIC PANEL - Abnormal; Notable for the following components:      Result Value   Potassium 3.2 (*)    Glucose, Bld 129 (*)    BUN 27 (*)    Creatinine, Ser 1.65 (*)    Total Protein 8.9 (*)    Total Bilirubin 3.2 (*)    GFR calc non Af Amer 57 (*)    All other components within normal limits  CBC WITH DIFFERENTIAL/PLATELET - Abnormal; Notable for the following components:   WBC 15.6 (*)    RBC 3.45 (*)    Hemoglobin 10.9 (*)    HCT 35.5 (*)    MCV 102.9 (*)    Neutro Abs 12.8 (*)    Monocytes Absolute 1.2 (*)    Abs Immature Granulocytes 0.09 (*)    All other components within normal limits  CSF CELL COUNT WITH DIFFERENTIAL - Abnormal; Notable for the following components:   Color, CSF CLEAR (*)    Appearance, CSF COLORLESS (*)    RBC Count, CSF 7 (*)    WBC, CSF 14 (*)    All other components within normal limits  CSF CELL COUNT WITH DIFFERENTIAL - Abnormal; Notable for the following components:   Color, CSF CLEAR (*)    Appearance, CSF COLORLESS (*)    RBC Count, CSF 5 (*)    WBC, CSF 14 (*)    All other components within normal limits  CSF CULTURE  URINE CULTURE  CULTURE, BLOOD (ROUTINE X 2)  CULTURE, BLOOD (ROUTINE X 2)  LACTIC ACID, PLASMA  LACTIC ACID, PLASMA  GLUCOSE, CSF  PROTEIN, CSF  URINALYSIS, COMPLETE (UACMP) WITH MICROSCOPIC  URINALYSIS, ROUTINE W REFLEX MICROSCOPIC  HERPES SIMPLEX VIRUS(HSV) DNA BY PCR  CRYPTOCOCCAL ANTIGEN, CSF  PATHOLOGIST SMEAR REVIEW   PATHOLOGIST SMEAR REVIEW   ____________________________________________  EKG   ____________________________________________  RADIOLOGY  ED MD interpretation:  Official radiology report(s): Dg Chest 2 View  Result Date: 02/07/2019 CLINICAL DATA:  Fever EXAM: CHEST - 2 VIEW COMPARISON:  None. FINDINGS: Lungs are clear. Heart size and pulmonary vascularity are normal. No adenopathy. No bone lesions. IMPRESSION: No edema or consolidation. Electronically Signed   By: Bretta Bang III M.D.   On: 02/07/2019 18:35   Ct Head Wo Contrast  Result Date: 02/07/2019 CLINICAL DATA:  Headache and vomiting since yesterday with associated fevers. History of fungal meningitis. EXAM: CT HEAD WITHOUT CONTRAST TECHNIQUE: Contiguous axial images were obtained from the base of the skull through the vertex without intravenous contrast. COMPARISON:  None. FINDINGS: Brain:  The brain shows a normal appearance without evidence of malformation, atrophy, old or acute small or large vessel infarction, mass lesion, hemorrhage, hydrocephalus or extra-axial collection. Vascular: No hyperdense vessel. No evidence of atherosclerotic calcification. Skull: Normal.  No traumatic finding.  No focal bone lesion. Sinuses/Orbits: Sinuses are clear. Orbits appear normal. Mastoids are clear. Other: None significant IMPRESSION: Normal head CT Electronically Signed   By: Paulina Fusi M.D.   On: 02/07/2019 18:25    ____________________________________________   PROCEDURES  Procedure(s) performed (including Critical Care): Critical care time 35 minutes this includes doing a spinal tap reviewing his old records and discussing him with the hospitalist.  Procedures oral consent obtained and written consent obtained for spinal tap.  Patient given Dilaudid half a milligram half a milligram of Ativan and some Zofran initially then patient prepped and draped in usual sterile fashion lidocaine was injected under the skin and into the  intervertebral interspace.  Needle was inserted patient straightened his back and had to restart again needle was inserted again after more anesthetic was injected clear colorless CSF was obtained opening pressure was 24 patient tolerated procedure well   ____________________________________________   INITIAL IMPRESSION / ASSESSMENT AND PLAN / ED COURSE  Patient with elevated white blood count mostly lymphocytes.  Uncertain what this could be.  No evidence of fungal infection at this point.  We will continue treating him for any possible infection.  Hospitalist will admit him.  Patient could have almost any sort of infection he has been off his HIV meds and his fungal meningitis meds for some time.             ____________________________________________   FINAL CLINICAL IMPRESSION(S) / ED DIAGNOSES  Final diagnoses:  Sepsis, due to unspecified organism, unspecified whether acute organ dysfunction present David Lopez)     ED Discharge Orders    None       Note:  This document was prepared using Dragon voice recognition software and may include unintentional dictation errors.    Arnaldo Natal, MD 02/07/19 7035    Arnaldo Natal, MD 02/17/19 [phone]

## 2019-02-07 NOTE — ED Notes (Signed)
ED TO INPATIENT HANDOFF REPORT  ED Nurse Name and Phone #:  Terance Hart, RN  [phone]  S Name/Age/Gender David Lopez 28 y.o. male Room/Bed: ED09A/ED09A  Code Status   Code Status: Prior  Home/SNF/Other Home Patient oriented to: self, place, time and situation Is this baseline? Yes   Triage Complete: Triage complete  Chief Complaint fever, vomit, not taking meningitis meds, headache  Triage Note Per pt non stop vomiting since yesterday evening with fevers up to 102. No diarrhea. No cough. 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 Pt given mask. Has not been taking medications per mom.  Mom reports he c/o severe headache last night but pt denies.    Allergies No Known Allergies  Level of Care/Admitting Diagnosis ED Disposition    ED Disposition Condition Comment   Admit  Hospital Area: [facility] [[id number]]  Level of Care: Stepdown [14]  Diagnosis: Sepsis [facility]) [[id number]]  Admitting Physician: Oralia Manis [[id number]]  Attending Physician: Oralia Manis [phone]  Estimated length of stay: past midnight tomorrow  Certification:: I certify this patient will need inpatient services for at least 2 midnights  Bed request comments: COVID 19 suspected, low risk, droplet and contact precautions  PT Class (Do Not Modify): Inpatient [101]  PT Acc Code (Do Not Modify): Private [1]       B Medical/Surgery History Past Medical History:  Diagnosis Date  . HIV (human immunodeficiency virus infection) (HCC)   . 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 No pertinent surgical history.   A IV Location/Drains/Wounds Patient Lines/Drains/Airways Status   Active Line/Drains/Airways    Name:   Placement date:   Placement time:   Site:   Days:   Peripheral IV 02/07/19 Right Antecubital   02/07/19    1803    Antecubital   less than 1   Peripheral IV 02/07/19 Left Antecubital   02/07/19    1800    Antecubital   less  than 1          Intake/Output Last 24 hours  Intake/Output Summary (Last 24 hours) at 02/07/2019 2243 Last data filed at 02/07/2019 2242 Gross per 24 hour  Intake 195.17 ml  Output -  Net 195.17 ml    Labs/Imaging Results for orders placed or performed during the hospital encounter of 02/07/19 (from the past 48 hour(s))  Comprehensive metabolic panel     Status: Abnormal   Collection Time: 02/07/19  5:42 PM  Result Value Ref Range   Sodium 140 135 - 145 mmol/L   Potassium 3.2 (L) 3.5 - 5.1 mmol/L   Chloride 107 98 - 111 mmol/L   CO2 22 22 - 32 mmol/L   Glucose, Bld 129 (H) 70 - 99 mg/dL   BUN 27 (H) 6 - 20 mg/dL   Creatinine, Ser 5.95 (H) 0.61 - 1.24 mg/dL   Calcium 9.0 8.9 - 63.8 mg/dL   Total Protein 8.9 (H) 6.5 - 8.1 g/dL   Albumin 4.3 3.5 - 5.0 g/dL   AST 30 15 - 41 U/L   ALT 19 0 - 44 U/L   Alkaline Phosphatase 83 38 - 126 U/L   Total Bilirubin 3.2 (H) 0.3 - 1.2 mg/dL   GFR calc non Af Amer 57 (L) >60 mL/min   GFR calc Af Amer >60 >60 mL/min   Anion gap 11 5 - 15    Comment: Performed at [facility], [address] [place], [place] [address]  CBC  with Differential     Status: Abnormal   Collection Time: 02/07/19  5:42 PM  Result Value Ref Range   WBC 15.6 (H) 4.0 - 10.5 K/uL   RBC 3.45 (L) 4.22 - 5.81 MIL/uL   Hemoglobin 10.9 (L) 13.0 - 17.0 g/dL   HCT 62.935.5 (L) 52.839.0 - 41.352.0 %   MCV 102.9 (H) 80.0 - 100.0 fL   MCH 31.6 26.0 - 34.0 pg   MCHC 30.7 30.0 - 36.0 g/dL   RDW 24.412.2 01.011.5 - 27.215.5 %   Platelets 150 150 - 400 K/uL   nRBC 0.0 0.0 - 0.2 %   Neutrophils Relative % 81 %   Neutro Abs 12.8 (H) 1.7 - 7.7 K/uL   Lymphocytes Relative 10 %   Lymphs Abs 1.5 0.7 - 4.0 K/uL   Monocytes Relative 8 %   Monocytes Absolute 1.2 (H) 0.1 - 1.0 K/uL   Eosinophils Relative 0 %   Eosinophils Absolute 0.0 0.0 - 0.5 K/uL   Basophils Relative 0 %   Basophils Absolute 0.0 0.0 - 0.1 K/uL   Immature Granulocytes 1 %   Abs Immature Granulocytes 0.09 (H) 0.00 - 0.07 K/uL     Comment: Performed at [facility], [address] [place], [place] [address]  Lactic acid, plasma     Status: None   Collection Time: 02/07/19  5:50 PM  Result Value Ref Range   Lactic Acid, Venous 1.6 0.5 - 1.9 mmol/L    Comment: Performed at [facility], [address] [place], [place] [address]  CSF cell count with differential collection tube #: tubes 1 and 3     Status: Abnormal (Preliminary result)   Collection Time: 02/07/19  7:14 PM  Result Value Ref Range   Tube # tubes 1 and 3    Color, CSF CLEAR (A) COLORLESS    Comment: TUBE 4   Appearance, CSF COLORLESS (A) CLEAR   Supernatant CLEAR    RBC Count, CSF 7 (H) 0 - 3 /cu mm   WBC, CSF 14 (HH) 0 - 5 /cu mm    Comment: CRITICAL RESULT CALLED TO, READ BACK BY AND VERIFIED WITH: CALLED TO RAQUEL DAVID @2133  02/07/2019 [facility] Performed at [facility], [address] [place], [place] [address]    Segmented Neutrophils-CSF PENDING 0 - 6 %   Lymphs, CSF PENDING 40 - 80 %   Monocyte-Macrophage-Spinal Fluid PENDING 15 - 45 %   Eosinophils, CSF PENDING 0 - 1 %   Other Cells, CSF PENDING   Glucose, CSF     Status: None   Collection Time: 02/07/19  7:15 PM  Result Value Ref Range   Glucose, CSF 70 40 - 70 mg/dL    Comment: Performed at [facility], [address] [place], [place] [address]  Protein, CSF     Status: None   Collection Time: 02/07/19  7:15 PM  Result Value Ref Range   Total  Protein, CSF 29 15 - 45 mg/dL    Comment: Performed at [facility], [address] [place], [place] [address]  CSF cell count with differential     Status: Abnormal (Preliminary result)   Collection Time: 02/07/19  7:40 PM  Result Value Ref Range   Tube # 2    Color, CSF CLEAR (A) COLORLESS   Appearance, CSF COLORLESS (A) CLEAR   Supernatant CLEAR    RBC Count, CSF 5 (H) 0 - 3 /cu mm   WBC, CSF 14 (  HH) 0 - 5 /cu mm    Comment: CRITICAL RESULT CALLED TO, READ BACK BY AND VERIFIED  WITH: CALLED TO RAQUEL DAVID  02/07/2019 [facility] Performed at [facility], [address] [place], [place] [address]    Segmented Neutrophils-CSF PENDING 0 - 6 %   Lymphs, CSF PENDING 40 - 80 %   Monocyte-Macrophage-Spinal Fluid PENDING 15 - 45 %   Eosinophils, CSF PENDING 0 - 1 %   Other Cells, CSF PENDING   Lactic acid, plasma     Status: None   Collection Time: 02/07/19  8:24 PM  Result Value Ref Range   Lactic Acid, Venous 1.0 0.5 - 1.9 mmol/L    Comment: Performed at [facility], [address] [place], [place] [address]   Dg Chest 2 View  Result Date: 02/07/2019 CLINICAL DATA:  Fever EXAM: CHEST - 2 VIEW COMPARISON:  None. FINDINGS: Lungs are clear. Heart size and pulmonary vascularity are normal. No adenopathy. No bone lesions. IMPRESSION: No edema or consolidation. Electronically Signed   By: Bretta Bang III M.D.   On: 02/07/2019 18:35   Ct Head Wo Contrast  Result Date: 02/07/2019 CLINICAL DATA:  Headache and vomiting since yesterday with associated fevers. History of fungal meningitis. EXAM: CT HEAD WITHOUT CONTRAST TECHNIQUE: Contiguous axial images were obtained from the base of the skull through the vertex without intravenous contrast. COMPARISON:  None. FINDINGS: Brain: The brain shows a normal appearance without evidence of malformation, atrophy, old or acute small or large vessel infarction, mass lesion, hemorrhage, hydrocephalus or extra-axial collection. Vascular: No hyperdense vessel. No evidence of atherosclerotic calcification. Skull: Normal.  No traumatic finding.  No focal bone lesion. Sinuses/Orbits: Sinuses are clear. Orbits appear normal. Mastoids are clear. Other: None significant IMPRESSION: Normal head CT Electronically Signed   By: Paulina Fusi M.D.   On: 02/07/2019 18:25    Pending Labs Unresulted Labs (From admission, onward)    Start     Ordered   02/07/19 1915  CSF culture with Stat gram stain  ONCE - STAT,   STAT    Question  Answer Comment  Are there also cytology or pathology orders on this specimen? No   Patient immune status Immunocompromised      02/07/19 1914   02/07/19 1914  Herpes simplex virus (HSV), DNA by PCR Cerebrospinal Fluid  Once,   STAT     02/07/19 1914   02/07/19 1914  Cryptococcal antigen, CSF ([facility] only)  Once,   STAT     02/07/19 1914   02/07/19 1750  Urinalysis, Routine w reflex microscopic  ONCE - STAT,   STAT     02/07/19 1749   02/07/19 1743  Culture, blood (routine x 2)  BLOOD CULTURE X 2,   STAT     02/07/19 1742   02/07/19 1742  Urinalysis, Complete w Microscopic  ONCE - STAT,   STAT     02/07/19 1742   02/07/19 1742  Urine culture  Once,   STAT     02/07/19 1742   Signed and Held  Catering manager [facility] only)  Add-on,   R     Signed and Held   Signed and Held  HIV-1 RNA quant-no reflex-bld  Add-on,   R     Signed and Held   Signed and Held  T-helper cells CD4/CD8 %  Add-on,   R     Signed and Held   Signed and Held  Basic metabolic panel  (amphotericin  B lipsome (AMBISOME) )  Daily,   R     Signed and Held   Signed and Held  Magnesium  (amphotericin B lipsome (AMBISOME) )  Daily,   R     Signed and Held   Signed and Held  Novel Coronavirus, NAA (hospital order; send-out to ref lab)  (Novel Coronavirus, NAA [facility] Order; send-out to ref lab) with precautions panel)  Once,   R    Question Answer Comment  Current symptoms Fever and Shortness of breath   Excluded other viral illnesses No (testing not indicated)   Patient immune status Immunocompromised      Signed and Held   Signed and Held  CBC  (enoxaparin (LOVENOX)    CrCl >/= 30 ml/min)  Once,   R    Comments:  Baseline for enoxaparin therapy IF NOT ALREADY DRAWN.  Notify MD if PLT < 100 K.    Signed and Held   Signed and Held  Creatinine, serum  (enoxaparin (LOVENOX)    CrCl >/= 30 ml/min)  Once,   R    Comments:  Baseline for enoxaparin therapy IF NOT ALREADY DRAWN.    Signed and Held   Signed and Held   Creatinine, serum  (enoxaparin (LOVENOX)    CrCl >/= 30 ml/min)  Weekly,   R    Comments:  while on enoxaparin therapy    Signed and Held   Signed and Held  Comprehensive metabolic panel  Tomorrow morning,   R     Signed and Held   Signed and Held  CBC with Differential/Platelet  Tomorrow morning,   R     Signed and Held          Vitals/Pain Today's Vitals   02/07/19 2010 02/07/19 2013 02/07/19 2030 02/07/19 2100  BP:  (!) 93/58 116/78 115/80  Pulse:  92 91 81  Resp:  20 12 (!) 24  Temp:  98.6 F (37 C)    TempSrc:  Oral    SpO2:  95% 98% 98%  Weight:      Height:      PainSc: 0-No pain       Isolation Precautions No active isolations  Medications Medications  acetaminophen (TYLENOL) tablet 650 mg (650 mg Oral Given 02/07/19 1827)  vancomycin (VANCOCIN) 500 mg in sodium chloride 0.9 % 100 mL IVPB (has no administration in time range)  ceFEPIme (MAXIPIME) 2 g in sodium chloride 0.9 % 100 mL IVPB (has no administration in time range)  vancomycin (VANCOCIN) IVPB 750 mg/150 ml premix (has no administration in time range)  ondansetron (ZOFRAN) injection 4 mg (4 mg Intravenous Given 02/07/19 1804)  sodium chloride 0.9 % bolus 1,000 mL (0 mLs Intravenous Stopped 02/07/19 1939)  vancomycin (VANCOCIN) IVPB 1000 mg/200 mL premix ( Intravenous Stopped 02/07/19 2202)  cefTRIAXone (ROCEPHIN) 1 g in sodium chloride 0.9 % 100 mL IVPB (0 g Intravenous Stopped 02/07/19 1939)  levofloxacin (LEVAQUIN) IVPB 500 mg (0 mg Intravenous Stopped 02/07/19 1939)  HYDROmorphone (DILAUDID) injection 0.5 mg (0.5 mg Intravenous Given 02/07/19 1918)  ondansetron (ZOFRAN) injection 4 mg (4 mg Intravenous Given 02/07/19 1918)  LORazepam (ATIVAN) injection 0.5 mg (0.5 mg Intravenous Given 02/07/19 1918)    Mobility walks Low fall risk   Focused Assessments Neuro Assessment Handoff:  Swallow screen pass? Yes          Neuro Assessment:   Neuro Checks:      Last Documented NIHSS Modified Score:   Has TPA been  given? No If patient is a Neuro Trauma and patient is going to OR before floor call report to 4N Charge nurse: [phone] or [phone]     R Recommendations: See Admitting Provider Note  Report given to:   Additional Notes:  Pt with Hx of HIv and fungal Meningitis. Here with fever N/V. Pt has received initial dose of vanc. Additional  dose orders and coming from Pharmacy to completed appropriate loading dose.

## 2019-02-08 DIAGNOSIS — B9620 Unspecified Escherichia coli [E. coli] as the cause of diseases classified elsewhere: Secondary | ICD-10-CM

## 2019-02-08 DIAGNOSIS — Z8661 Personal history of infections of the central nervous system: Secondary | ICD-10-CM

## 2019-02-08 DIAGNOSIS — N189 Chronic kidney disease, unspecified: Secondary | ICD-10-CM

## 2019-02-08 DIAGNOSIS — B20 Human immunodeficiency virus [HIV] disease: Secondary | ICD-10-CM

## 2019-02-08 DIAGNOSIS — Z79899 Other long term (current) drug therapy: Secondary | ICD-10-CM

## 2019-02-08 DIAGNOSIS — R1110 Vomiting, unspecified: Secondary | ICD-10-CM

## 2019-02-08 DIAGNOSIS — Z8619 Personal history of other infectious and parasitic diseases: Secondary | ICD-10-CM

## 2019-02-08 DIAGNOSIS — R7881 Bacteremia: Secondary | ICD-10-CM

## 2019-02-08 DIAGNOSIS — Z87891 Personal history of nicotine dependence: Secondary | ICD-10-CM

## 2019-02-08 LAB — PATHOLOGIST SMEAR REVIEW

## 2019-02-08 LAB — COMPREHENSIVE METABOLIC PANEL
ALT: 16 U/L (ref 0–44)
AST: 24 U/L (ref 15–41)
Albumin: 3.4 g/dL — ABNORMAL LOW (ref 3.5–5.0)
Alkaline Phosphatase: 68 U/L (ref 38–126)
Anion gap: 9 (ref 5–15)
BUN: 26 mg/dL — ABNORMAL HIGH (ref 6–20)
CO2: 22 mmol/L (ref 22–32)
Calcium: 8.3 mg/dL — ABNORMAL LOW (ref 8.9–10.3)
Chloride: 109 mmol/L (ref 98–111)
Creatinine, Ser: 1.67 mg/dL — ABNORMAL HIGH (ref 0.61–1.24)
GFR calc Af Amer: >60 mL/min (ref >60)
GFR calc non Af Amer: 56 mL/min — ABNORMAL LOW (ref >60)
Glucose, Bld: 125 mg/dL — ABNORMAL HIGH (ref 70–99)
Potassium: 3.5 mmol/L (ref 3.5–5.1)
Sodium: 140 mmol/L (ref 135–145)
Total Bilirubin: 2.5 mg/dL — ABNORMAL HIGH (ref 0.3–1.2)
Total Protein: 7.4 g/dL (ref 6.5–8.1)

## 2019-02-08 LAB — BLOOD CULTURE ID PANEL (REFLEXED)

## 2019-02-08 LAB — CBC WITH DIFFERENTIAL/PLATELET
Abs Immature Granulocytes: 0.11 10*3/uL — ABNORMAL HIGH (ref 0.00–0.07)
Basophils Absolute: 0 10*3/uL (ref 0.0–0.1)
Basophils Relative: 0 %
Eosinophils Absolute: 0 10*3/uL (ref 0.0–0.5)
Eosinophils Relative: 0 %
HCT: 26.8 % — ABNORMAL LOW (ref 39.0–52.0)
Hemoglobin: 8.7 g/dL — ABNORMAL LOW (ref 13.0–17.0)
Immature Granulocytes: 1 %
Lymphocytes Relative: 5 %
Lymphs Abs: 0.8 10*3/uL (ref 0.7–4.0)
MCH: 32 pg (ref 26.0–34.0)
MCHC: 32.5 g/dL (ref 30.0–36.0)
MCV: 98.5 fL (ref 80.0–100.0)
Monocytes Absolute: 1.2 10*3/uL — ABNORMAL HIGH (ref 0.1–1.0)
Monocytes Relative: 7 %
Neutro Abs: 14.3 10*3/uL — ABNORMAL HIGH (ref 1.7–7.7)
Neutrophils Relative %: 87 %
Platelets: 146 10*3/uL — ABNORMAL LOW (ref 150–400)
RBC: 2.72 MIL/uL — ABNORMAL LOW (ref 4.22–5.81)
RDW: 12.3 % (ref 11.5–15.5)
WBC: 16.4 10*3/uL — ABNORMAL HIGH (ref 4.0–10.5)
nRBC: 0 % (ref 0.0–0.2)

## 2019-02-08 LAB — URINALYSIS, COMPLETE (UACMP) WITH MICROSCOPIC
Bilirubin Urine: NEGATIVE
Glucose, UA: NEGATIVE mg/dL
Ketones, ur: NEGATIVE mg/dL
Nitrite: POSITIVE — AB
Protein, ur: 30 mg/dL — AB
Specific Gravity, Urine: 1.016 (ref 1.005–1.030)
WBC, UA: >50 WBC/hpf — ABNORMAL HIGH (ref 0–5)
pH: 6 (ref 5.0–8.0)

## 2019-02-08 LAB — MAGNESIUM: Magnesium: 2 mg/dL (ref 1.7–2.4)

## 2019-02-08 LAB — CRYPTOCOCCAL ANTIGEN, CSF
Crypto Ag: POSITIVE — AB
Cryptococcal Ag Titer: 5 — AB

## 2019-02-08 MED ORDER — PROMETHAZINE HCL 25 MG/ML IJ SOLN
25.0000 mg | Freq: Four times a day (QID) | INTRAMUSCULAR | Status: DC | PRN
  Administered 2019-02-08 (×2): 25 mg via INTRAVENOUS
  Filled 2019-02-08 (×3): qty 1

## 2019-02-08 MED ORDER — SODIUM CHLORIDE 0.9 % IV BOLUS FOR AMBISOME
500.0000 mL | INTRAVENOUS | Status: DC
  Administered 2019-02-08: 500 mL via INTRAVENOUS

## 2019-02-08 MED ORDER — SODIUM CHLORIDE 0.9 % IV SOLN
1.0000 g | Freq: Three times a day (TID) | INTRAVENOUS | Status: DC
  Administered 2019-02-08 – 2019-02-09 (×4): 1 g via INTRAVENOUS
  Filled 2019-02-08 (×6): qty 1

## 2019-02-08 MED ORDER — MEPERIDINE HCL 25 MG/ML IJ SOLN
25.0000 mg | INTRAMUSCULAR | Status: DC | PRN
  Filled 2019-02-08: qty 1

## 2019-02-08 MED ORDER — DEXTROSE 5 % IV SOLN
10.0000 mg/kg | Freq: Three times a day (TID) | INTRAVENOUS | Status: DC
  Administered 2019-02-08 (×3): 635 mg via INTRAVENOUS
  Filled 2019-02-08 (×4): qty 12.7
  Filled 2019-02-08: qty 10

## 2019-02-08 MED ORDER — DIPHENHYDRAMINE HCL 50 MG/ML IJ SOLN: 25.0000 mg | Freq: Every day | INTRAMUSCULAR | Status: DC | PRN

## 2019-02-08 MED ORDER — DIPHENHYDRAMINE HCL 25 MG PO CAPS
25.0000 mg | ORAL_CAPSULE | Freq: Every day | ORAL | Status: DC | PRN
  Administered 2019-02-08: 25 mg via ORAL
  Filled 2019-02-08: qty 1

## 2019-02-08 MED ORDER — DEXTROSE 5 % IV BOLUS: 10.0000 mL | Freq: Every day | INTRAVENOUS | Status: DC

## 2019-02-08 MED ORDER — DEXTROSE 5 % IV SOLN
3.0000 mg/kg | INTRAVENOUS | Status: DC
  Administered 2019-02-08: 190 mg via INTRAVENOUS
  Filled 2019-02-08 (×2): qty 47.5

## 2019-02-08 MED ORDER — ONDANSETRON HCL 4 MG/2ML IJ SOLN
4.0000 mg | Freq: Four times a day (QID) | INTRAMUSCULAR | Status: DC | PRN
  Filled 2019-02-08 (×2): qty 2

## 2019-02-08 MED ORDER — DEXTROSE 5% FOR FLUSHING BEFORE AND AFTER AMBISOME
10.0000 mL | INTRAVENOUS | Status: DC
  Administered 2019-02-08: 10 mL via INTRAVENOUS
  Filled 2019-02-08 (×2): qty 50

## 2019-02-08 MED ORDER — FLUCONAZOLE IN SODIUM CHLORIDE 400-0.9 MG/200ML-% IV SOLN
400.0000 mg | INTRAVENOUS | Status: DC
  Administered 2019-02-09: 04:00:00 400 mg via INTRAVENOUS
  Filled 2019-02-08: qty 200

## 2019-02-08 MED ORDER — ACETAMINOPHEN 325 MG PO TABS: 650.0000 mg | ORAL_TABLET | Freq: Four times a day (QID) | ORAL | Status: DC | PRN

## 2019-02-08 MED ORDER — SODIUM CHLORIDE 0.9 % IV SOLN
INTRAVENOUS | Status: AC
  Administered 2019-02-08: 02:00:00 via INTRAVENOUS

## 2019-02-08 MED ORDER — ENOXAPARIN SODIUM 40 MG/0.4ML ~~LOC~~ SOLN
40.0000 mg | SUBCUTANEOUS | Status: DC
  Filled 2019-02-08: qty 0.4

## 2019-02-08 MED ORDER — ONDANSETRON HCL 4 MG PO TABS: 4.0000 mg | ORAL_TABLET | Freq: Four times a day (QID) | ORAL | Status: DC | PRN

## 2019-02-08 MED ORDER — DEXTROSE 5 % IV BOLUS
10.0000 mL | Freq: Every day | INTRAVENOUS | Status: DC
  Administered 2019-02-08: 10 mL via INTRAVENOUS
  Filled 2019-02-08 (×3): qty 25

## 2019-02-08 MED ORDER — ACETAMINOPHEN 650 MG RE SUPP: 650.0000 mg | Freq: Four times a day (QID) | RECTAL | Status: DC | PRN

## 2019-02-08 MED ORDER — ACETAMINOPHEN 325 MG PO TABS: 650.0000 mg | ORAL_TABLET | Freq: Every day | ORAL | Status: DC | PRN

## 2019-02-08 MED ORDER — VANCOMYCIN HCL 10 G IV SOLR
1250.0000 mg | INTRAVENOUS | Status: DC
  Filled 2019-02-08: qty 1250

## 2019-02-08 MED ORDER — FLUCYTOSINE 250 MG PO CAPS
25.0000 mg/kg | ORAL_CAPSULE | Freq: Four times a day (QID) | ORAL | Status: DC
  Administered 2019-02-08 (×2): 1500 mg via ORAL
  Filled 2019-02-08: qty 3
  Filled 2019-02-08 (×2): qty 6
  Filled 2019-02-08 (×2): qty 3

## 2019-02-08 NOTE — Progress Notes (Signed)
Patient is stable this shift,afebrile,iv antibiotics continue,remanis on droplet isolation for suspected COVID-19

## 2019-02-08 NOTE — ED Notes (Addendum)
Pt refusing to be tested for the corona virus. Provider, Dr. Anne Hahn, notified.

## 2019-02-08 NOTE — Progress Notes (Addendum)
PHARMACY - PHYSICIAN COMMUNICATION CRITICAL VALUE ALERT - BLOOD CULTURE IDENTIFICATION (BCID)  David Lopez is an 28 y.o. male who presented to [facility] on 02/07/2019 with a chief complaint of emesis and fever.   Assessment:   BCID indicated 2 anaerobic bottles grew Enterobacteriaceae species, specifically E.coli. Additionally, the organism is not Carbapenem resistant. For this reason, will need to discontinue Cefepime. Patient has NKDA.   Name of physician (or Provider) Contacted: Dr. Auburn Bilberry  Current antibiotics:  Vancomycin Cefepime  Amphotericin B liposome Acyclovir Flucytosine   Changes to prescribed antibiotics recommended:  Discontinue Cefepime.  Start Meropenem 1 g Q8H.   Results for orders placed or performed during the hospital encounter of 02/07/19  Blood Culture ID Panel (Reflexed) (Collected: 02/07/2019  5:43 PM)  Result Value Ref Range   Enterococcus species NOT DETECTED NOT DETECTED   Listeria monocytogenes NOT DETECTED NOT DETECTED   Staphylococcus species NOT DETECTED NOT DETECTED   Staphylococcus aureus (BCID) NOT DETECTED NOT DETECTED   Streptococcus species NOT DETECTED NOT DETECTED   Streptococcus agalactiae NOT DETECTED NOT DETECTED   Streptococcus pneumoniae NOT DETECTED NOT DETECTED   Streptococcus pyogenes NOT DETECTED NOT DETECTED   Acinetobacter baumannii NOT DETECTED NOT DETECTED   Enterobacteriaceae species DETECTED (A) NOT DETECTED   Enterobacter cloacae complex NOT DETECTED NOT DETECTED   Escherichia coli DETECTED (A) NOT DETECTED   Klebsiella oxytoca NOT DETECTED NOT DETECTED   Klebsiella pneumoniae NOT DETECTED NOT DETECTED   Proteus species NOT DETECTED NOT DETECTED   Serratia marcescens NOT DETECTED NOT DETECTED   Carbapenem resistance NOT DETECTED NOT DETECTED   Haemophilus influenzae NOT DETECTED NOT DETECTED   Neisseria meningitidis NOT DETECTED NOT DETECTED   Pseudomonas aeruginosa NOT DETECTED NOT DETECTED   Candida  albicans NOT DETECTED NOT DETECTED   Candida glabrata NOT DETECTED NOT DETECTED   Candida krusei NOT DETECTED NOT DETECTED   Candida parapsilosis NOT DETECTED NOT DETECTED   Candida tropicalis NOT DETECTED NOT DETECTED   Thank you for allowing pharmacy to be a part of this patient's care.   Katha Cabal, PharmD 02/08/2019  8:49 AM

## 2019-02-08 NOTE — Progress Notes (Signed)
[employer] - [place] at [facility]                                                                                                                                                                                  Patient Demographics   David Lopez, is a 28 y.o. male, DOB - 30-Nov-1990, WUJ:[id number]  Admit date - 02/07/2019   Admitting Physician Oralia Manis, MD  Outpatient Primary MD for the patient is Patient, No Pcp Per   LOS - 1  Subjective: Pt states that he is nauseous but feeling better denies any abdominal pain headaches or any photophobia    Review of Systems:   CONSTITUTIONAL: Positive fever. No fatigue, weakness. No weight gain, no weight loss.  EYES: No blurry or double vision.  ENT: No tinnitus. No postnasal drip. No redness of the oropharynx.  RESPIRATORY: No cough, no wheeze, no hemoptysis. No dyspnea.  CARDIOVASCULAR: No chest pain. No orthopnea. No palpitations. No syncope.  GASTROINTESTINAL: Positive nausea, no vomiting or diarrhea. No abdominal pain. No melena or hematochezia.  GENITOURINARY: No dysuria or hematuria.  ENDOCRINE: No polyuria or nocturia. No heat or cold intolerance.  HEMATOLOGY: No anemia. No bruising. No bleeding.  INTEGUMENTARY: No rashes. No lesions.  MUSCULOSKELETAL: No arthritis. No swelling. No gout.  NEUROLOGIC: No numbness, tingling, or ataxia. No seizure-type activity.  PSYCHIATRIC: No anxiety. No insomnia. No ADD.    Vitals:   Vitals:   02/08/19 0125 02/08/19 0239 02/08/19 0558 02/08/19 0856  BP: 129/76  123/77 107/71  Pulse: (!) 115  91 84  Resp: Temp:  (!) 100.9 F (38.3 C) 99.2 F (37.3 C) 98.9 F (37.2 C)  TempSrc:  Oral Oral Oral  SpO2: 97%  100% 100%  Weight:      Height:        Wt Readings from Last 3 Encounters:  02/07/19 63.5 kg  09/05/18 62.4 kg     Intake/Output Summary (Last 24 hours) at 02/08/2019 1126 Last data filed at 02/08/2019 1017 Gross per 24 hour  Intake 2574.97 ml   Output 300 ml  Net 2274.97 ml    Physical Exam:   GENERAL: Pleasant-appearing in no apparent distress.  HEAD, EYES, EARS, NOSE AND THROAT: Atraumatic, normocephalic. Extraocular muscles are intact. Pupils equal and reactive to light. Sclerae anicteric. No conjunctival injection. No oro-pharyngeal erythema.  NECK: Supple. There is no jugular venous distention. No bruits, no lymphadenopathy, no thyromegaly.  HEART: Regular rate and rhythm,. No murmurs, no rubs, no clicks.  LUNGS: Clear to auscultation bilaterally. No rales or rhonchi. No wheezes.  ABDOMEN: Soft, flat, nontender, nondistended. Has good bowel sounds. No hepatosplenomegaly appreciated.  EXTREMITIES: No evidence of any cyanosis, clubbing, or peripheral edema.  +2 pedal and radial pulses bilaterally.  NEUROLOGIC: The patient is alert, awake, and oriented x3 with no focal motor or sensory deficits appreciated bilaterally.  SKIN: Moist and warm with no rashes appreciated.  Psych: Not anxious, depressed LN: No inguinal LN enlargement    Antibiotics   Anti-infectives (From admission, onward)   Start     Dose/Rate Route Frequency Ordered Stop   02/08/19 1000  vancomycin (VANCOCIN) IVPB 750 mg/150 ml premix     750 mg 150 mL/hr over 60 Minutes Intravenous Every 12 hours 02/07/19 2230     02/08/19 0900  meropenem (MERREM) 1 g in sodium chloride 0.9 % 100 mL IVPB     1 g 200 mL/hr over 30 Minutes Intravenous Every 8 hours 02/08/19 0859     02/08/19 0200  amphotericin B liposome (AMBISOME) 190 mg in dextrose 5 % 500 mL IVPB     3 mg/kg  63.5 kg 250 mL/hr over 120 Minutes Intravenous Every 24 hours 02/08/19 0126     02/08/19 0200  flucytosine (ANCOBON) capsule 1,500 mg     25 mg/kg  63.5 kg Oral Every 6 hours 02/08/19 0126     02/08/19 0200  acyclovir (ZOVIRAX) 635 mg in dextrose 5 % 100 mL IVPB     10 mg/kg  63.5 kg 112.7 mL/hr over 60 Minutes Intravenous Every 8 hours 02/08/19 0126     02/07/19 2230  vancomycin (VANCOCIN)  500 mg in sodium chloride 0.9 % 100 mL IVPB     500 mg 100 mL/hr over 60 Minutes Intravenous  Once 02/07/19 2225 02/08/19 0024   02/07/19 2230  ceFEPIme (MAXIPIME) 2 g in sodium chloride 0.9 % 100 mL IVPB  Status:  Discontinued     2 g 200 mL/hr over 30 Minutes Intravenous Every 8 hours 02/07/19 2227 02/08/19 0857   02/07/19 1815  vancomycin (VANCOCIN) IVPB 1000 mg/200 mL premix     1,000 mg 200 mL/hr over 60 Minutes Intravenous  Once 02/07/19 1805 02/07/19 2202   02/07/19 1815  cefTRIAXone (ROCEPHIN) 1 g in sodium chloride 0.9 % 100 mL IVPB     1 g 200 mL/hr over 30 Minutes Intravenous  Once 02/07/19 1805 02/07/19 1939   02/07/19 1815  levofloxacin (LEVAQUIN) IVPB 500 mg     500 mg 100 mL/hr over 60 Minutes Intravenous  Once 02/07/19 1805 02/07/19 1939      Medications   Scheduled Meds: . enoxaparin (LOVENOX) injection  40 mg Subcutaneous Q24H  . flucytosine  25 mg/kg Oral Q6H  . sodium chloride  500 mL Intravenous Q24H   Continuous Infusions: . sodium chloride 100 mL/hr at 02/08/19 0225  . acyclovir 635 mg (02/08/19 1056)  . amphotericin  B  Liposome (AMBISOME) ADULT IV Stopped (02/08/19 0547)  . dextrose 10 mL (02/08/19 0326)  . meropenem (MERREM) IV 1 g (02/08/19 1058)  . vancomycin 750 mg (02/08/19 1102)   PRN Meds:.acetaminophen **OR** acetaminophen, acetaminophen, acetaminophen, diphenhydrAMINE **OR** diphenhydrAMINE, meperidine (DEMEROL) injection, ondansetron **OR** ondansetron (ZOFRAN) IV, promethazine   Data Review:   Micro Results Recent Results (from the past 240 hour(s))  Culture, blood (routine x 2)     Status: None (Preliminary result)   Collection Time: 02/07/19  5:43 PM  Result Value Ref Range Status   Specimen Description BLOOD LEFT ANTECUBITAL  Final   Special Requests   Final    BOTTLES DRAWN AEROBIC AND ANAEROBIC Blood Culture results may  not be optimal due to an excessive volume of blood received in culture bottles   Culture  Setup Time   Final     Organism ID to follow GRAM NEGATIVE RODS ANAEROBIC BOTTLE ONLY CRITICAL RESULT CALLED TO, READ BACK BY AND VERIFIED WITH: Yves Dill 02/08/19 0809 KLW Performed at [facility], [address] [place], [place] [address]    Culture GRAM NEGATIVE RODS  Final   Report Status PENDING  Incomplete  Blood Culture ID Panel (Reflexed)     Status: Abnormal   Collection Time: 02/07/19  5:43 PM  Result Value Ref Range Status   Enterococcus species NOT DETECTED NOT DETECTED Final   Listeria monocytogenes NOT DETECTED NOT DETECTED Final   Staphylococcus species NOT DETECTED NOT DETECTED Final   Staphylococcus aureus (BCID) NOT DETECTED NOT DETECTED Final   Streptococcus species NOT DETECTED NOT DETECTED Final   Streptococcus agalactiae NOT DETECTED NOT DETECTED Final   Streptococcus pneumoniae NOT DETECTED NOT DETECTED Final   Streptococcus pyogenes NOT DETECTED NOT DETECTED Final   Acinetobacter baumannii NOT DETECTED NOT DETECTED Final   Enterobacteriaceae species DETECTED (A) NOT DETECTED Final    Comment: Enterobacteriaceae represent a large family of gram-negative bacteria, not a single organism. CRITICAL RESULT CALLED TO, READ BACK BY AND VERIFIED WITH: CHARLES SHANLEVER 02/08/19 0809 KLW    Enterobacter cloacae complex NOT DETECTED NOT DETECTED Final   Escherichia coli DETECTED (A) NOT DETECTED Final    Comment: CRITICAL RESULT CALLED TO, READ BACK BY AND VERIFIED WITH: CHARLES SHANLEVER 02/08/19 0809 KLW    Klebsiella oxytoca NOT DETECTED NOT DETECTED Final   Klebsiella pneumoniae NOT DETECTED NOT DETECTED Final   Proteus species NOT DETECTED NOT DETECTED Final   Serratia marcescens NOT DETECTED NOT DETECTED Final   Carbapenem resistance NOT DETECTED NOT DETECTED Final   Haemophilus influenzae NOT DETECTED NOT DETECTED Final   Neisseria meningitidis NOT DETECTED NOT DETECTED Final   Pseudomonas aeruginosa NOT DETECTED NOT DETECTED Final   Candida albicans NOT DETECTED NOT  DETECTED Final   Candida glabrata NOT DETECTED NOT DETECTED Final   Candida krusei NOT DETECTED NOT DETECTED Final   Candida parapsilosis NOT DETECTED NOT DETECTED Final   Candida tropicalis NOT DETECTED NOT DETECTED Final    Comment: Performed at [facility], [address] [place], [place] [address]  Culture, blood (routine x 2)     Status: None (Preliminary result)   Collection Time: 02/07/19  5:48 PM  Result Value Ref Range Status   Specimen Description BLOOD RIGHT ANTECUBITAL  Final   Special Requests   Final    BOTTLES DRAWN AEROBIC AND ANAEROBIC Blood Culture results may not be optimal due to an excessive volume of blood received in culture bottles   Culture  Setup Time   Final    ANAEROBIC BOTTLE ONLY GRAM NEGATIVE RODS CRITICAL RESULT CALLED TO, READ BACK BY AND VERIFIED WITH: Yves Dill 02/08/19 0809 KLW Performed at [facility], [address] [place], [place] [address]    Culture GRAM NEGATIVE RODS  Final   Report Status PENDING  Incomplete  CSF culture with Stat gram stain     Status: None (Preliminary result)   Collection Time: 02/07/19  7:15 PM  Result Value Ref Range Status   Specimen Description CSF  Final   Special Requests Immunocompromised  Final   Gram Stain   Final    NO ORGANISMS SEEN Performed at [facility], [address] [place], [place]  [address]    Culture PENDING  Incomplete   Report Status PENDING  Incomplete    Radiology Reports Dg Chest 2 View  Result Date: 02/07/2019 CLINICAL DATA:  Fever EXAM: CHEST - 2 VIEW COMPARISON:  None. FINDINGS: Lungs are clear. Heart size and pulmonary vascularity are normal. No adenopathy. No bone lesions. IMPRESSION: No edema or consolidation. Electronically Signed   By: Bretta Bang III M.D.   On: 02/07/2019 18:35   Ct Head Wo Contrast  Result Date: 02/07/2019 CLINICAL DATA:  Headache and vomiting since yesterday with associated fevers. History of fungal meningitis.  EXAM: CT HEAD WITHOUT CONTRAST TECHNIQUE: Contiguous axial images were obtained from the base of the skull through the vertex without intravenous contrast. COMPARISON:  None. FINDINGS: Brain: The brain shows a normal appearance without evidence of malformation, atrophy, old or acute small or large vessel infarction, mass lesion, hemorrhage, hydrocephalus or extra-axial collection. Vascular: No hyperdense vessel. No evidence of atherosclerotic calcification. Skull: Normal.  No traumatic finding.  No focal bone lesion. Sinuses/Orbits: Sinuses are clear. Orbits appear normal. Mastoids are clear. Other: None significant IMPRESSION: Normal head CT Electronically Signed   By: Paulina Fusi M.D.   On: 02/07/2019 18:25     CBC Recent Labs  Lab 02/07/19 1742 02/08/19 0221  WBC 15.6* 16.4*  HGB 10.9* 8.7*  HCT 35.5* 26.8*  PLT 150 146*  MCV 102.9* 98.5  MCH 31.6 32.0  MCHC 30.7 32.5  RDW 12.2 12.3  LYMPHSABS 1.5 0.8  MONOABS 1.2* 1.2*  EOSABS 0.0 0.0  BASOSABS 0.0 0.0    Chemistries  Recent Labs  Lab 02/07/19 1742 02/08/19 0221  NA 140 140  K 3.2* 3.5  CL 107 109  CO2 22 22  GLUCOSE 129* 125*  BUN 27* 26*  CREATININE 1.65* 1.67*  CALCIUM 9.0 8.3*  MG  --  2.0  AST 30 24  ALT 19 16  ALKPHOS 83 68  BILITOT 3.2* 2.5*   ------------------------------------------------------------------------------------------------------------------ estimated creatinine clearance is 60.7 mL/min (A) (by C-G formula based on SCr of 1.67 mg/dL (H)). ------------------------------------------------------------------------------------------------------------------ No results for input(s): HGBA1C in the last 72 hours. ------------------------------------------------------------------------------------------------------------------ No results for input(s): CHOL, HDL, LDLCALC, TRIG, CHOLHDL, LDLDIRECT in the last 72  hours. ------------------------------------------------------------------------------------------------------------------ No results for input(s): TSH, T4TOTAL, T3FREE, THYROIDAB in the last 72 hours.  Invalid input(s): FREET3 ------------------------------------------------------------------------------------------------------------------ No results for input(s): VITAMINB12, FOLATE, FERRITIN, TIBC, IRON, RETICCTPCT in the last 72 hours.  Coagulation profile No results for input(s): INR, PROTIME in the last 168 hours.  No results for input(s): DDIMER in the last 72 hours.  Cardiac Enzymes No results for input(s): CKMB, TROPONINI, MYOGLOBIN in the last 168 hours.  Invalid input(s): CK ------------------------------------------------------------------------------------------------------------------ Invalid input(s): POCBNP    Assessment & Plan  Pt is 27 y.o with hiv now with fever and sepsis   1. Sepsis (HCC) suspect due to urinary tract infection with positive blood culture Antibiotics changed to meropenem ID consult pending Patient refuses covid test Discontinue vancomycin once seen by ID okay with them  2.   HIV (human immunodeficiency virus infection) (HCC) -ID consult for recommendation on [facility] therapy.  CD4 and viral load studies sent    3. CKD (chronic kidney disease), stage III (HCC) -stable monitor renal function  4.  Anemia likely related to HIV monitor CBC      Code Status Orders  (From admission, onward)         Start     Ordered   02/08/19 0127  Full code  Continuous     02/08/19 0126        Code Status History    Date Active Date Inactive Code Status Order ID Comments User Context   09/04/2018 0824 09/05/2018 1516 Full Code [id number]  Arnaldo Natal, MD Inpatient           Consults id   DVT Prophylaxis  Lovenox   Lab Results  Component Value Date   PLT 146 (L) 02/08/2019     Time Spent in minutes   Greater than 50% of time  spent in care coordination and counseling patient regarding the condition and plan of care.   Auburn Bilberry M.D on 02/08/2019 at 11:26 AM  Between 7am to 6pm - Pager - [phone]  After 6pm go to [email or web address] - Social research officer, government  [employer]   Office  [phone]

## 2019-02-08 NOTE — Progress Notes (Signed)
Pharmacy Antibiotic Note  David Lopez is a 28 y.o. male admitted on 02/07/2019 with sepsis.  Pharmacy has been consulted for vancomycine dosing. 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 With recent change in renal function will adjust Vancomycin dose.  Plan: Vancomycin 1250 mg IV Q 24 hrs. Goal AUC 400-550. Expected AUC: 498.8 SCr used: 1.65  Cssmin: 10.9   Height: 5\' 11"  (180.3 cm) Weight: 140 lb (63.5 kg) IBW/kg (Calculated) : 75.3    Temp (24hrs), Avg:100.3 F (37.9 C), Min:98.6 F (37 C), Max:103 F (39.4 C)  Recent Labs  Lab 02/07/19 1742 02/07/19 1750 02/07/19 2024 02/08/19 0221  WBC 15.6*  --   --  16.4*  CREATININE 1.65*  --   --  1.67*  LATICACIDVEN  --  1.6 1.0  --     Estimated Creatinine Clearance: 60.7 mL/min (A) (by C-G formula based on SCr of 1.67 mg/dL (H)).    No Known Allergies  Thank you for allowing pharmacy to be a part of this patient's care.  Thomasene Ripple, PharmD, BCPS Clinical Pharmacist 02/08/2019

## 2019-02-08 NOTE — Progress Notes (Signed)
Ancobon has not yet arrived from [facility], per pharmacy. Will relay to day nurse as next dose is 0900

## 2019-02-08 NOTE — Consult Note (Addendum)
NAMEElfego Lopez  DOB: 25-Dec-1990  MRN: [id number]  Date/Time: 02/08/2019 1:12 PM  REQUESTING PROVIDER Subjective:  REASON FOR CONSULT:  ? David Lopez is a 27 y.o.male  with a history of AIDS, cryptococcal meningitis, herpes zoster Presents to the hospital with  Vomiting. Pt says he was with his friend on Saturday smoking marijuana and was feeling tired. He then drove ot [facility] and got chicken, After a few hours he had some abdominal pain and vomited , Next day he continued to vomit and was in bed- He came to the ED on Monday. He did not have significant headache like he did before when he had the meningitis In the ED  had a temp of 103 . CT head was N LP was done and opening pressure was 24. WBC was 14, ( 88% L) TP 29, glucose 70,  He was started on IV amphotericin, flucytosine and vanco and ceftriaxone and IV acyclovir Blood culture now positive for e.coli He is from [place], used to live there until Jan 2020 when he moved here. 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 He has not been taking HAARt biktarvy since Jan- He had cryptococcal meningiits in Dec 2018 and then was treated again in July 2019 in [place].says he has been taking fluconazole intermittently  He says he has no head ache or neck pain or blurred vision which he had in 2018 On 06/02/18 Cd4 138 (11%)  AIDS diagnosed in 2018  Medical History Oral candidiasis Treated syphilis Cryptococcal meningitis AIDS   PSH-none  SH Does not smoke tobacco, smokes marijuana Family History  Problem Relation Age of Onset  . Diabetes Mellitus II Paternal Grandmother   . CAD Neg Hx    No Known Allergies  Current Facility-Administered Medications  Medication Dose Route Frequency Provider Last Rate Last Dose  . acetaminophen (TYLENOL) tablet 650 mg  650 mg Oral Q6H PRN Oralia Manis, MD       Or  . acetaminophen (TYLENOL) suppository 650 mg  650 mg Rectal Q6H PRN  Oralia Manis, MD      . acetaminophen (TYLENOL) tablet 650 mg  650 mg Oral Q6H PRN Oralia Manis, MD   650 mg at 02/08/19 0037  . acetaminophen (TYLENOL) tablet 650 mg  650 mg Oral Daily PRN Oralia Manis, MD      . acyclovir (ZOVIRAX) 635 mg in dextrose 5 % 100 mL IVPB  10 mg/kg Intravenous Willow Ora, MD 112.7 mL/hr at 02/08/19 1056 635 mg at 02/08/19 1056  . amphotericin B liposome (AMBISOME) 190 mg in dextrose 5 % 500 mL IVPB  3 mg/kg Intravenous Q24H Oralia Manis, MD   Stopped at 02/08/19 [phone]  . dextrose 5 % bolus 10 mL  10 mL Intravenous Daily Oralia Manis, MD 999 mL/hr at 02/08/19 0326 10 mL at 02/08/19 0326  . diphenhydrAMINE (BENADRYL) injection 25 mg  25 mg Intravenous Daily PRN Oralia Manis, MD       Or  . diphenhydrAMINE (BENADRYL) capsule 25 mg  25 mg Oral Daily PRN Oralia Manis, MD   25 mg at 02/08/19 0315  . enoxaparin (LOVENOX) injection 40 mg  40 mg Subcutaneous Q24H Oralia Manis, MD      . flucytosine (ANCOBON) capsule 1,500 mg  25 mg/kg Oral Q6H Oralia Manis, MD   1,500 mg at 02/08/19 1307  . meperidine (DEMEROL) injection 25 mg  25 mg Intravenous Q15 min  PRN Oralia Manis, MD      . meropenem [facility]) 1 g in sodium chloride 0.9 % 100 mL IVPB  1 g Intravenous Q8H Auburn Bilberry, MD 200 mL/hr at 02/08/19 1058 1 g at 02/08/19 1058  . ondansetron (ZOFRAN) tablet 4 mg  4 mg Oral Q6H PRN Oralia Manis, MD       Or  . ondansetron [facility]) injection 4 mg  4 mg Intravenous Q6H PRN Oralia Manis, MD      . promethazine (PHENERGAN) injection 25 mg  25 mg Intravenous Q6H PRN Oralia Manis, MD   25 mg at 02/08/19 1303  . sodium chloride 0.9 % bolus 500 mL  500 mL Intravenous Q24H Oralia Manis, MD   500 mL at 02/08/19 0219  . vancomycin (VANCOCIN) IVPB 750 mg/150 ml premix  750 mg Intravenous Rigoberto Noel, MD 150 mL/hr at 02/08/19 1102 750 mg at 02/08/19 1102     Abtx:  Anti-infectives (From admission, onward)   Start     Dose/Rate Route Frequency Ordered Stop    02/08/19 1000  vancomycin (VANCOCIN) IVPB 750 mg/150 ml premix     750 mg 150 mL/hr over 60 Minutes Intravenous Every 12 hours 02/07/19 2230     02/08/19 0900  meropenem (MERREM) 1 g in sodium chloride 0.9 % 100 mL IVPB     1 g 200 mL/hr over 30 Minutes Intravenous Every 8 hours 02/08/19 0859     02/08/19 0200  amphotericin B liposome (AMBISOME) 190 mg in dextrose 5 % 500 mL IVPB     3 mg/kg  63.5 kg 250 mL/hr over 120 Minutes Intravenous Every 24 hours 02/08/19 0126     02/08/19 0200  flucytosine (ANCOBON) capsule 1,500 mg     25 mg/kg  63.5 kg Oral Every 6 hours 02/08/19 0126     02/08/19 0200  acyclovir (ZOVIRAX) 635 mg in dextrose 5 % 100 mL IVPB     10 mg/kg  63.5 kg 112.7 mL/hr over 60 Minutes Intravenous Every 8 hours 02/08/19 0126     02/07/19 2230  vancomycin (VANCOCIN) 500 mg in sodium chloride 0.9 % 100 mL IVPB     500 mg 100 mL/hr over 60 Minutes Intravenous  Once 02/07/19 2225 02/08/19 0024   02/07/19 2230  ceFEPIme (MAXIPIME) 2 g in sodium chloride 0.9 % 100 mL IVPB  Status:  Discontinued     2 g 200 mL/hr over 30 Minutes Intravenous Every 8 hours 02/07/19 2227 02/08/19 0857   02/07/19 1815  vancomycin (VANCOCIN) IVPB 1000 mg/200 mL premix     1,000 mg 200 mL/hr over 60 Minutes Intravenous  Once 02/07/19 1805 02/07/19 2202   02/07/19 1815  cefTRIAXone (ROCEPHIN) 1 g in sodium chloride 0.9 % 100 mL IVPB     1 g 200 mL/hr over 30 Minutes Intravenous  Once 02/07/19 1805 02/07/19 1939   02/07/19 1815  levofloxacin (LEVAQUIN) IVPB 500 mg     500 mg 100 mL/hr over 60 Minutes Intravenous  Once 02/07/19 1805 02/07/19 1939      REVIEW OF SYSTEMS:  Const: negative fever, negative chills, negative weight loss Eyes: negative diplopia or visual changes, negative eye pain ENT: negative coryza, negative sore throat Resp: negative cough, hemoptysis, dyspnea Cards: negative for chest pain, palpitations, lower extremity edema GU: negative for frequency, dysuria and hematuria,  urine smells he says GI: some abdominal pain, no diarrhea, bleeding, constipation Skin: negative for rash and pruritus Heme: negative for easy bruising and gum/nose bleeding  MS: negative for myalgias, arthralgias, back pain and muscle weakness Neurolo:negative for headaches, dizziness, vertigo, memory problems  Psych: negative for feelings of anxiety, depression  Endocrine: negative for thyroid, diabetes issues Allergy/Immunology- negative for any medication or food allergies ? Pertinent Positives include : Objective:  VITALS:  BP 107/71 (BP Location: Left Arm)   Pulse 84   Temp 98.9 F (37.2 C) (Oral)   Resp 18   Ht  (1.803 m)   Wt 63.5 kg   SpO2 100%   BMI 19.53 kg/m  PHYSICAL EXAM:  General: Alert, cooperative, no distress, appears stated age.  Head: Normocephalic, without obvious abnormality, atraumatic. Eyes: Conjunctivae clear, anicteric sclerae. Pupils are equal ENT Nares normal. No drainage or sinus tenderness. Lips, mucosa, and tongue normal. No Thrush Neck: Supple, symmetrical, no adenopathy, thyroid: non tender no carotid bruit and no JVD. Back: No CVA tenderness. Lungs: Clear to auscultation bilaterally. No Wheezing or Rhonchi. No rales. Heart: Regular rate and rhythm, no murmur, rub or gallop. Abdomen: Soft, non-tender,not distended. Bowel sounds normal. No masses Extremities: atraumatic, no cyanosis. No edema. No clubbing Skin: No rashes or lesions. Or bruising Lymph: Cervical, supraclavicular normal. Neurologic: Grossly non-focal Pertinent Labs Lab Results CBC    Component Value Date/Time   WBC 16.4 (H) 02/08/2019 0221   RBC 2.72 (L) 02/08/2019 0221   HGB 8.7 (L) 02/08/2019 0221   HGB 11.0 (L) 09/04/2018 0025   HCT 26.8 (L) 02/08/2019 0221   HCT 33.6 (L) 09/04/2018 0025   PLT 146 (L) 02/08/2019 0221   PLT 190 09/04/2018 0025   MCV 98.5 02/08/2019 0221   MCV 98 (H) 09/04/2018 0025   MCH 32.0 02/08/2019 0221   MCHC 32.5 02/08/2019 0221   RDW  12.3 02/08/2019 0221   RDW 12.0 (L) 09/04/2018 0025   LYMPHSABS 0.8 02/08/2019 0221   LYMPHSABS 0.8 09/04/2018 0025   MONOABS 1.2 (H) 02/08/2019 0221   EOSABS 0.0 02/08/2019 0221   EOSABS 0.0 09/04/2018 0025   BASOSABS 0.0 02/08/2019 0221   BASOSABS 0.0 09/04/2018 0025    CMP Latest Ref Rng & Units 02/08/2019 02/07/2019 09/05/2018  Glucose 70 - 99 mg/dL 161(W) 960(A) 92  BUN 6 - 20 mg/dL 54(U) 98(J) 15  Creatinine 0.61 - 1.24 mg/dL 1.91(Y) 7.82(N) 5.62(Z)  Sodium 135 - 145 mmol/L 140 140 136  Potassium 3.5 - 5.1 mmol/L 3.5 3.2(L) 4.2  Chloride 98 - 111 mmol/L 109 107 103  CO2 22 - 32 mmol/L Calcium 8.9 - 10.3 mg/dL 8.3(L) 9.0 9.5  Total Protein 6.5 - 8.1 g/dL 7.4 3.0(Q) -  Total Bilirubin 0.3 - 1.2 mg/dL 2.5(H) 3.2(H) -  Alkaline Phos 38 - 126 U/L 68 83 -  AST 15 - 41 U/L 24 30 -  ALT 0 - 44 U/L 16 19 -      Microbiology: Recent Results (from the past 240 hour(s))  Culture, blood (routine x 2)     Status: None (Preliminary result)   Collection Time: 02/07/19  5:43 PM  Result Value Ref Range Status   Specimen Description BLOOD LEFT ANTECUBITAL  Final   Special Requests   Final    BOTTLES DRAWN AEROBIC AND ANAEROBIC Blood Culture results may not be optimal due to an excessive volume of blood received in culture bottles   Culture  Setup Time   Final    Organism ID to follow GRAM NEGATIVE RODS ANAEROBIC BOTTLE ONLY CRITICAL RESULT CALLED TO, READ BACK BY AND VERIFIED WITH: CHARLES  [facility] 02/08/19 0809 KLW Performed at [facility], [address] [place], [place] [address]    Culture GRAM NEGATIVE RODS  Final   Report Status PENDING  Incomplete  Blood Culture ID Panel (Reflexed)     Status: Abnormal   Collection Time: 02/07/19  5:43 PM  Result Value Ref Range Status   Enterococcus species NOT DETECTED NOT DETECTED Final   Listeria monocytogenes NOT DETECTED NOT DETECTED Final   Staphylococcus species NOT DETECTED NOT DETECTED Final   Staphylococcus  aureus (BCID) NOT DETECTED NOT DETECTED Final   Streptococcus species NOT DETECTED NOT DETECTED Final   Streptococcus agalactiae NOT DETECTED NOT DETECTED Final   Streptococcus pneumoniae NOT DETECTED NOT DETECTED Final   Streptococcus pyogenes NOT DETECTED NOT DETECTED Final   Acinetobacter baumannii NOT DETECTED NOT DETECTED Final   Enterobacteriaceae species DETECTED (A) NOT DETECTED Final    Comment: Enterobacteriaceae represent a large family of gram-negative bacteria, not a single organism. CRITICAL RESULT CALLED TO, READ BACK BY AND VERIFIED WITH: CHARLES SHANLEVER 02/08/19 0809 KLW    Enterobacter cloacae complex NOT DETECTED NOT DETECTED Final   Escherichia coli DETECTED (A) NOT DETECTED Final    Comment: CRITICAL RESULT CALLED TO, READ BACK BY AND VERIFIED WITH: CHARLES SHANLEVER 02/08/19 0809 KLW    Klebsiella oxytoca NOT DETECTED NOT DETECTED Final   Klebsiella pneumoniae NOT DETECTED NOT DETECTED Final   Proteus species NOT DETECTED NOT DETECTED Final   Serratia marcescens NOT DETECTED NOT DETECTED Final   Carbapenem resistance NOT DETECTED NOT DETECTED Final   Haemophilus influenzae NOT DETECTED NOT DETECTED Final   Neisseria meningitidis NOT DETECTED NOT DETECTED Final   Pseudomonas aeruginosa NOT DETECTED NOT DETECTED Final   Candida albicans NOT DETECTED NOT DETECTED Final   Candida glabrata NOT DETECTED NOT DETECTED Final   Candida krusei NOT DETECTED NOT DETECTED Final   Candida parapsilosis NOT DETECTED NOT DETECTED Final   Candida tropicalis NOT DETECTED NOT DETECTED Final    Comment: Performed at [facility], [address] [place], [place] [address]  Culture, blood (routine x 2)     Status: None (Preliminary result)   Collection Time: 02/07/19  5:48 PM  Result Value Ref Range Status   Specimen Description BLOOD RIGHT ANTECUBITAL  Final   Special Requests   Final    BOTTLES DRAWN AEROBIC AND ANAEROBIC Blood Culture results may not be optimal due to an  excessive volume of blood received in culture bottles   Culture  Setup Time   Final    ANAEROBIC BOTTLE ONLY GRAM NEGATIVE RODS CRITICAL RESULT CALLED TO, READ BACK BY AND VERIFIED WITH: Yves Dill 02/08/19 0809 KLW Performed at [facility], [address] [place], [place] [address]    Culture GRAM NEGATIVE RODS  Final   Report Status PENDING  Incomplete  CSF culture with Stat gram stain     Status: None (Preliminary result)   Collection Time: 02/07/19  7:15 PM  Result Value Ref Range Status   Specimen Description CSF  Final   Special Requests Immunocompromised  Final   Gram Stain   Final    NO ORGANISMS SEEN Performed at [facility], [address] [place], [place] [address]    Culture PENDING  Incomplete   Report Status PENDING  Incomplete    IMAGING RESULTS:  I have personally reviewed the films ? Impression/Recommendation 27 y.o.male  with a history of AIDS, cryptococcal meningitis, herpes zoster Presents to the hospital with  Vomiting. He is not taking HAART  Vomiting -likely due to e.coli bacteremia  initially thought to be due to cryptococcal meningitis, but opening pressure okay, Csf 16 wbc but normal protein and glucose No evidence of HSV or zoster- will DC acyclovir No evidence clinically of active cryptococcal meningitis so will DC  amphotericin / flucytosine Will do fluconazole for secondary treatment  for cryptococcus as CD4 < 200 Once he is able to take PO IV fluconazole to be changed to PO  E coli bacteremia - likely UTI Will get renal ultrasound to look for any hydro/stone Currently on meropenem DC vancomycin  ?AIDS- not on HAARt currently- Vl and cd4 sent - used to take [employer]ik[employer]vy- no insurance- will [facility] him to community center- will discuss with Laurette SchimkeJulie Willis ? CKD- pt says it was due to medication- avoid nephrotoxic drugs  ?treated syphilis- will check RPR/vDRL csf CSF has lymphocytosis- could be residual cryto, VS AIDS,   ___________________________________________________ Discussed with patient in detail

## 2019-02-09 ENCOUNTER — Inpatient Hospital Stay: Payer: Self-pay

## 2019-02-09 DIAGNOSIS — Z87438 Personal history of other diseases of male genital organs: Secondary | ICD-10-CM

## 2019-02-09 LAB — T-HELPER CELLS CD4/CD8 %
% CD 4 Pos. Lymph.: 9.2 % — ABNORMAL LOW (ref 30.8–58.5)
Absolute CD 4 Helper: 83 /uL — ABNORMAL LOW (ref 359–1519)
Basophils Absolute: 0 10*3/uL (ref 0.0–0.2)
Basos: 0 %
CD3+CD4+ Cells/CD3+CD8+ Cells Bld: 0.17 — ABNORMAL LOW (ref 0.92–3.72)
CD3+CD8+ Cells # Bld: 497 /uL (ref 109–897)
CD3+CD8+ Cells NFr Bld: 55.2 % — ABNORMAL HIGH (ref 12.0–35.5)
EOS (ABSOLUTE): 0 10*3/uL (ref 0.0–0.4)
Eos: 0 %
Hematocrit: 25.6 % — ABNORMAL LOW (ref 37.5–51.0)
Hemoglobin: 9.1 g/dL — ABNORMAL LOW (ref 13.0–17.7)
Immature Grans (Abs): 0 10*3/uL (ref 0.0–0.1)
Immature Granulocytes: 0 %
Lymphocytes Absolute: 0.9 10*3/uL (ref 0.7–3.1)
Lymphs: 6 %
MCH: 33.3 pg — ABNORMAL HIGH (ref 26.6–33.0)
MCHC: 35.5 g/dL (ref 31.5–35.7)
MCV: 94 fL (ref 79–97)
Monocytes Absolute: 1.1 10*3/uL — ABNORMAL HIGH (ref 0.1–0.9)
Monocytes: 7 %
Neutrophils Absolute: 13.3 10*3/uL — ABNORMAL HIGH (ref 1.4–7.0)
Neutrophils: 87 %
Platelets: 134 10*3/uL — ABNORMAL LOW (ref 150–450)
RBC: 2.73 x10E6/uL — ABNORMAL LOW (ref 4.14–5.80)
RDW: 11.3 % — ABNORMAL LOW (ref 11.6–15.4)
WBC: 15.4 10*3/uL — ABNORMAL HIGH (ref 3.4–10.8)

## 2019-02-09 LAB — BASIC METABOLIC PANEL
Anion gap: 8 (ref 5–15)
BUN: 17 mg/dL (ref 6–20)
CO2: 21 mmol/L — ABNORMAL LOW (ref 22–32)
Calcium: 8.5 mg/dL — ABNORMAL LOW (ref 8.9–10.3)
Chloride: 112 mmol/L — ABNORMAL HIGH (ref 98–111)
Creatinine, Ser: 1.34 mg/dL — ABNORMAL HIGH (ref 0.61–1.24)
GFR calc Af Amer: >60 mL/min (ref >60)
GFR calc non Af Amer: >60 mL/min (ref >60)
Glucose, Bld: 94 mg/dL (ref 70–99)
Potassium: 3.5 mmol/L (ref 3.5–5.1)
Sodium: 141 mmol/L (ref 135–145)

## 2019-02-09 LAB — CBC
HCT: 27 % — ABNORMAL LOW (ref 39.0–52.0)
Hemoglobin: 8.7 g/dL — ABNORMAL LOW (ref 13.0–17.0)
MCH: 31.3 pg (ref 26.0–34.0)
MCHC: 32.2 g/dL (ref 30.0–36.0)
MCV: 97.1 fL (ref 80.0–100.0)
Platelets: 131 10*3/uL — ABNORMAL LOW (ref 150–400)
RBC: 2.78 MIL/uL — ABNORMAL LOW (ref 4.22–5.81)
RDW: 12.4 % (ref 11.5–15.5)
WBC: 5.5 10*3/uL (ref 4.0–10.5)
nRBC: 0 % (ref 0.0–0.2)

## 2019-02-09 LAB — CRYPTOCOCCAL ANTIGEN
Crypto Ag: POSITIVE — AB
Cryptococcal Ag Titer: 1280 — AB

## 2019-02-09 LAB — URINE CULTURE: Culture: NO GROWTH

## 2019-02-09 LAB — MAGNESIUM: Magnesium: 2 mg/dL (ref 1.7–2.4)

## 2019-02-09 LAB — HSV DNA BY PCR (REFERENCE LAB): HSV 1 DNA: NEGATIVE

## 2019-02-09 LAB — HERPES SIMPLEX VIRUS(HSV) DNA BY PCR: HSV 2 DNA: NEGATIVE

## 2019-02-09 MED ORDER — FLUCONAZOLE 200 MG PO TABS: 800.0000 mg | ORAL_TABLET | Freq: Every day | ORAL | 5 refills | Status: AC

## 2019-02-09 MED ORDER — FLUCONAZOLE IN SODIUM CHLORIDE 400-0.9 MG/200ML-% IV SOLN
800.0000 mg | INTRAVENOUS | Status: DC
  Filled 2019-02-09 (×2): qty 400

## 2019-02-09 MED ORDER — CEFDINIR 300 MG PO CAPS: 300.0000 mg | ORAL_CAPSULE | Freq: Two times a day (BID) | ORAL | 0 refills | Status: AC

## 2019-02-09 MED ORDER — CEFDINIR 300 MG PO CAPS
300.0000 mg | ORAL_CAPSULE | Freq: Two times a day (BID) | ORAL | Status: DC
  Filled 2019-02-09 (×2): qty 1

## 2019-02-09 NOTE — Progress Notes (Signed)
Verbal order to dc tele

## 2019-02-09 NOTE — Progress Notes (Signed)
Discharge summary reviewed. All notes and belongings given upon discharge.

## 2019-02-09 NOTE — Progress Notes (Signed)
Patient David Lopez was admitted to hospital [facility]) on 02/07/2019 and discharged 02/09/2019.

## 2019-02-09 NOTE — Progress Notes (Signed)
Date of Admission:  02/07/2019     ID: David Lopez is a 28 y.o. male Principal Problem:   Sepsis (HCC) Active Problems:   CKD (chronic kidney disease), stage III (HCC)   HIV (human immunodeficiency virus infection) (HCC)   Meningitis E.coli abcteremia cryptococcal meningitis     Subjective:  Pt says he is feeling better No nausea or vomiting No headache Soreness at the site of the lumbar puncture He wants to go home Medications:  . sodium chloride  500 mL Intravenous Q24H    Objective: Vital signs in last 24 hours: Temp:  [99.3 F (37.4 C)-99.7 F (37.6 C)] 99.3 F (37.4 C) (04/08 0442) Pulse Rate:  [67-92] 67 (04/08 0757) Resp:  [18] 18 (04/08 0442) BP: (104-123)/(58-80) 104/80 (04/08 0757) SpO2:  [97 %-100 %] 100 % (04/08 0757)  PHYSICAL EXAM:  General: Alert, cooperative, no distress, appears stated age.  Head: Normocephalic, without obvious abnormality, atraumatic. Eyes: Conjunctivae clear, anicteric sclerae. Pupils are equal ENT Nares normal. No drainage or sinus tenderness. Lips, mucosa, and tongue normal. No Thrush Neck: Supple, symmetrical, no adenopathy, thyroid: non tender no carotid bruit and no JVD. Back: No CVA tenderness. Lungs: Clear to auscultation bilaterally. No Wheezing or Rhonchi. No rales. Heart: Regular rate and rhythm, no murmur, rub or gallop. Abdomen: Soft, non-tender,not distended. Bowel sounds normal. No masses Extremities: atraumatic, no cyanosis. No edema. No clubbing Skin: No rashes or lesions. Or bruising Lymph: Cervical, supraclavicular normal. Neurologic: Grossly non-focal  Lab Results Recent Labs    02/08/19 0221 02/09/19 0455  WBC 15.4*  16.4* 5.5  HGB 8.7*  9.1* 8.7*  HCT 26.8*  25.6* 27.0*  NA 140 141  K 3.5 3.5  CL 109 112*  CO2 22 21*  BUN 26* 17  CREATININE 1.67* 1.34*   Liver Panel Recent Labs    02/07/19 1742 02/08/19 0221  PROT 8.9* 7.4  ALBUMIN 4.3 3.4*  AST 30 24  ALT 19 16  ALKPHOS 83 68   BILITOT 3.2* 2.5*   Sedimentation Rate No results for input(s): ESRSEDRATE in the last 72 hours. C-Reactive Protein No results for input(s): CRP in the last 72 hours.  Microbiology:  Studies/Results: Dg Chest 2 View  Result Date: 02/07/2019 CLINICAL DATA:  Fever EXAM: CHEST - 2 VIEW COMPARISON:  None. FINDINGS: Lungs are clear. Heart size and pulmonary vascularity are normal. No adenopathy. No bone lesions. IMPRESSION: No edema or consolidation. Electronically Signed   By: Bretta Bang III M.D.   On: 02/07/2019 18:35   Ct Head Wo Contrast  Result Date: 02/07/2019 CLINICAL DATA:  Headache and vomiting since yesterday with associated fevers. History of fungal meningitis. EXAM: CT HEAD WITHOUT CONTRAST TECHNIQUE: Contiguous axial images were obtained from the base of the skull through the vertex without intravenous contrast. COMPARISON:  None. FINDINGS: Brain: The brain shows a normal appearance without evidence of malformation, atrophy, old or acute small or large vessel infarction, mass lesion, hemorrhage, hydrocephalus or extra-axial collection. Vascular: No hyperdense vessel. No evidence of atherosclerotic calcification. Skull: Normal.  No traumatic finding.  No focal bone lesion. Sinuses/Orbits: Sinuses are clear. Orbits appear normal. Mastoids are clear. Other: None significant IMPRESSION: Normal head CT Electronically Signed   By: Paulina Fusi M.D.   On: 02/07/2019 18:25     Assessment/Plan: 27 y.o.male  with a history of AIDS, cryptococcal meningitis, herpes zoster Presents to the hospital with  Vomiting. He is not taking HAART as he has no insurance  Vomiting -likely  due to e.coli bacteremia  initially thought to be due to cryptococcal meningitis, but opening pressure 24 , Csf 16 wbc but normal protein and glucose No evidence of HSV or zoster- will DC acyclovir No evidence clinically of active cryptococcal meningitis so  DC  amphotericin / flucytosine, on Fluconazole CSF  cryptococcus titer is 1:5 ( was 1:80) last year and the Serum is high but he is not interested in taking IV amphotericin or PO flucytosine We can do  fluconazole 800mg  once a day for 2 months and then may drop to maintenance once HAARt is restarted  Spoke to [employer] GlobalJulie willis at  Once he is able to take PO IV fluconazole to be changed to PO  E coli bacteremia - likely UTI, but urine culture N Will get renal ultrasound to look for any hydro/stone Currently on meropenem   ?AIDS- not on HAARt currently- Vl and cd4 sent - used to take [employer]vy- no insurance- will refer him to community center- will discuss with Laurette SchimkeJulie Willis ?Raynelle FanningJulie willis-HIV case [phone]( [phone])  talked to him and is sending papers for enrollment at the community center.  CKD- pt says it was due to medication- avoid nephrotoxic drugs  ?treated syphilis- will check RPR/vDRL csf  PT is adamant to leave today- Told him that e.coli sensi are not back but he is willing to leave AMA Will send him on omnicef 300mg  PO BID for 10 days. Fluconazole 800mg  once a day for 2 months  Informed the patient without proper care and adherence he is going to get worse . He understands . Discussed with Dr.Pyreddy

## 2019-02-09 NOTE — Progress Notes (Signed)
[facility] - [place] at [facility]   PATIENT NAME: David HaskellDewayne Lopez    MR#:  [id number]  DATE OF BIRTH:  02/03/1991  SUBJECTIVE:  CHIEF COMPLAINT:   Chief Complaint  Patient presents with  . Emesis  . Fever  Patient seen and evaluated today No complaints of any headache, blurry vision No complaints of any neck stiffness No fever Tolerating diet okay  REVIEW OF SYSTEMS:    ROS  CONSTITUTIONAL: No documented fever. No fatigue, weakness. No weight gain, no weight loss.  EYES: No blurry or double vision.  ENT: No tinnitus. No postnasal drip. No redness of the oropharynx.  RESPIRATORY: No cough, no wheeze, no hemoptysis. No dyspnea.  CARDIOVASCULAR: No chest pain. No orthopnea. No palpitations. No syncope.  GASTROINTESTINAL: No nausea, no vomiting or diarrhea. No abdominal pain. No melena or hematochezia.  GENITOURINARY: No dysuria or hematuria.  ENDOCRINE: No polyuria or nocturia. No heat or cold intolerance.  HEMATOLOGY: No anemia. No bruising. No bleeding.  INTEGUMENTARY: No rashes. No lesions.  MUSCULOSKELETAL: No arthritis. No swelling. No gout.  NEUROLOGIC: No numbness, tingling, or ataxia. No seizure-type activity.  PSYCHIATRIC: No anxiety. No insomnia. No ADD.   DRUG ALLERGIES:  No Known Allergies  VITALS:  Blood pressure 104/80, pulse 67, temperature 99.3 F (37.4 C), temperature source Oral, resp. rate 18, height 5\' 11"  (1.803 m), weight 63.5 kg, SpO2 100 %.  PHYSICAL EXAMINATION:   Physical Exam  GENERAL:  28 y.o.-year-old patient lying in the bed with no acute distress.  EYES: Pupils equal, round, reactive to light and accommodation. No scleral icterus. Extraocular muscles intact.  HEENT: Head atraumatic, normocephalic. Oropharynx and nasopharynx clear.  NECK:  Supple, no jugular venous distention. No thyroid enlargement, no tenderness.  LUNGS: Normal breath sounds bilaterally, no wheezing, rales, rhonchi. No use of accessory muscles of  respiration.  CARDIOVASCULAR: S1, S2 normal. No murmurs, rubs, or gallops.  ABDOMEN: Soft, nontender, nondistended. Bowel sounds present. No organomegaly or mass.  EXTREMITIES: No cyanosis, clubbing or edema b/l.    NEUROLOGIC: Cranial nerves II through XII are intact. No focal Motor or sensory deficits b/l.   PSYCHIATRIC: The patient is alert and oriented x 3.  SKIN: No obvious rash, lesion, or ulcer.   LABORATORY PANEL:   CBC Recent Labs  Lab 02/09/19 0455  WBC 5.5  HGB 8.7*  HCT 27.0*  PLT 131*   ------------------------------------------------------------------------------------------------------------------ Chemistries  Recent Labs  Lab 02/08/19 0221 02/09/19 0455  NA 140 141  K 3.5 3.5  CL 109 112*  CO2 22 21*  GLUCOSE 125* 94  BUN 26* 17  CREATININE 1.67* 1.34*  CALCIUM 8.3* 8.5*  MG 2.0 2.0  AST 24  --   ALT 16  --   ALKPHOS 68  --   BILITOT 2.5*  --    ------------------------------------------------------------------------------------------------------------------  Cardiac Enzymes No results for input(s): TROPONINI in the last 168 hours. ------------------------------------------------------------------------------------------------------------------  RADIOLOGY:  Dg Chest 2 View  Result Date: 02/07/2019 CLINICAL DATA:  Fever EXAM: CHEST - 2 VIEW COMPARISON:  None. FINDINGS: Lungs are clear. Heart size and pulmonary vascularity are normal. No adenopathy. No bone lesions. IMPRESSION: No edema or consolidation. Electronically Signed   By: Bretta BangWilliam  Woodruff III M.D.   On: 02/07/2019 18:35   Ct Head Wo Contrast  Result Date: 02/07/2019 CLINICAL DATA:  Headache and vomiting since yesterday with associated fevers. History of fungal meningitis. EXAM: CT HEAD WITHOUT CONTRAST TECHNIQUE: Contiguous axial images were obtained from the base of the  skull through the vertex without intravenous contrast. COMPARISON:  None. FINDINGS: Brain: The brain shows a normal  appearance without evidence of malformation, atrophy, old or acute small or large vessel infarction, mass lesion, hemorrhage, hydrocephalus or extra-axial collection. Vascular: No hyperdense vessel. No evidence of atherosclerotic calcification. Skull: Normal.  No traumatic finding.  No focal bone lesion. Sinuses/Orbits: Sinuses are clear. Orbits appear normal. Mastoids are clear. Other: None significant IMPRESSION: Normal head CT Electronically Signed   By: Paulina Fusi M.D.   On: 02/07/2019 18:25     ASSESSMENT AND PLAN:  28 year old male patient with history of AIDS, cryptococcal meningitis, herpes zoster currently under hospitalist service for sepsis.  -E. 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 Management plans discussed with the patient, family and they are in agreement.  CODE STATUS: Full code  DVT Prophylaxis: SCDs  TOTAL TIME TAKING CARE OF THIS PATIENT: 37 minutes.   POSSIBLE D/C IN 2 to 3 DAYS, DEPENDING ON CLINICAL CONDITION.  Ihor Austin M.D on 02/09/2019 at 11:53 AM  Between 7am to 6pm - Pager - [phone]  After 6pm go to [email or web address] - password [email or web address]  [facility] [place] [facility]  Office  [phone]  CC: Primary care physician; Patient, No Pcp Per  Note: This dictation was prepared with Dragon dictation along with smaller phrase  technology. Any transcriptional errors that result from this process are unintentional.

## 2019-02-09 NOTE — Progress Notes (Signed)
CRITICAL VALUE ALERT  Critical Value:  Cryptococcal antigen positive, titer 1280  Date & Time Notied:  02/09/2019 Provider Notified: Pyreddy  Orders Received/Actions taken: ID already consulted

## 2019-02-10 ENCOUNTER — Telehealth (HOSPITAL_COMMUNITY): Payer: Self-pay | Admitting: Pharmacist

## 2019-02-10 LAB — RPR: RPR Ser Ql: REACTIVE — AB

## 2019-02-10 LAB — CULTURE, BLOOD (ROUTINE X 2)

## 2019-02-10 LAB — RPR, QUANT+TP ABS (REFLEX)
Rapid Plasma Reagin, Quant: 1:1 {titer} — ABNORMAL HIGH
T Pallidum Abs: REACTIVE — AB

## 2019-02-10 LAB — VDRL, CSF: VDRL Quant, CSF: NONREACTIVE

## 2019-02-10 NOTE — Progress Notes (Addendum)
Update 4/10: I called the patient today and was sent immediately to VM. Left a message to pick-up prescription at [facility]. Of note, I called [facility] and was informed that the patient has yet to pick-up the antibiotic. Additionally, I ensured that [facility] prescription was discontinued.   4/9: Called and LVM two times (@ 1054 & 1342). I wanted to talk to the patient about changes in antibiotic therapy d/t BCx (E.coli, 3 out of 4 bottles) and sensitivities. However, I have been unsuccessful.   I called [facility] in [place], per chart review this is her preferred pharmacy, and spoke directly to the pharmacist. Verbal prescription for Ciprofloxacin 500 mg BID x 10 days and to discontinue Omicef, called about @ 11 AM.   Thank you for allowing pharmacy to be a part of this patient's care.  Cephus Shelling, PharmD

## 2019-02-10 NOTE — Discharge Summary (Signed)
[facility] - [place] at [facility]   PATIENT NAME: David HaskellDewayne Wahlquist    MR#:  [id number]  DATE OF BIRTH:  02/03/1991  DATE OF ADMISSION:  02/07/2019 ADMITTING PHYSICIAN: Oralia Manisavid Willis, MD  DATE OF DISCHARGE: 02/09/2019  4:12 PM  PRIMARY CARE PHYSICIAN: Patient, No Pcp Per   ADMISSION DIAGNOSIS:  Sepsis, due to unspecified organism, unspecified whether acute organ dysfunction present (HCC) [A41.9] Sepsis (HCC) [A41.9]  DISCHARGE DIAGNOSIS:  Sepsis E. coli bacteremia HIV infection Chronic kidney disease stage III Nausea and vomiting  SECONDARY DIAGNOSIS:   Past Medical History:  Diagnosis Date  . HIV (human immunodeficiency virus infection) (HCC)   . 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 male who presents with chief complaint as above.  Patient states he has had nausea with vomiting for the past 3 days.  He is also had what he believes is intermittent fever.  He presented to the ED today with documented fever, tachycardia, leukocytosis.  He has an HIV patient and states that for the past 2 months he is been unable to obtain any medication, including heart therapy.  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  He was also seen in our hospital at the end of last year with a suspicion for herpes zoster.  He meets sepsis criteria in our ED today, and hospitalist were called for admission  HOSPITAL COURSE:  Patient was admitted to medical floor.  Patient was initially covered for bacterial viral and cryptococcal infection.  Patient was started on acyclovir, amphotericin and flucytosine. Infectious disease consultation was done.  COVID-19 test was also planned.  Patient refused COVID-19 test.  Patient had lumbar puncture and CSF analysis was done.  HSV DNA was negative.CSF culture did not reveal any growth. Cryptococcal antigen titers  were reviewed with the infectious disease attending.  ID attending recommended oral fluconazole 8 and MGS prophylactically to continue for long-term.  Patient received IV meropenem antibiotic during hospitalization.  Blood cultures grew E. coli sensitivities were checked.  Patient discharged on oral ciprofloxacin antibiotic and will follow-up with infectious disease attending.  Patient was worked up with renal ultrasound.  Patient will follow-up with community clinic for HIV infection.  CONSULTS OBTAINED:  Infectious disease consult  DRUG ALLERGIES:  No Known Allergies  DISCHARGE MEDICATIONS:   Allergies as of 02/09/2019   No Known Allergies     Medication List    TAKE these medications   Biktarvy 50-200-25 MG Tabs tablet Generic drug:  bictegravir-emtricitabine-tenofovir AF Take 1 tablet by mouth daily.   cefdinir 300 MG capsule Commonly known as:  OMNICEF Take 1 capsule (300 mg total) by mouth every 12 (twelve) hours for 10 days.   fluconazole 200 MG tablet Commonly known as:  Diflucan Take 4 tablets (800 mg total) by mouth daily for 30 days.     Ciprofloxacin 500 mg twice daily for full 10 days  Omnicef antibiotic discontinued  Today  Patient seen on the day of discharge No fever No shortness of breath No chest pain Hemodynamically stable  VITAL SIGNS:  Blood pressure 104/80, pulse 67, temperature 99.3 F (37.4 C), temperature source Oral, resp. rate 18, height 5\' 11"  (1.803 m), weight 63.5 kg, SpO2 100 %.  I/O:  No intake or output data in the 24 hours ending 02/10/19 1047  PHYSICAL EXAMINATION:  Physical Exam  GENERAL:  27  y.o.-year-old patient lying in the bed with no acute distress.  LUNGS: Normal breath sounds bilaterally, no wheezing, rales,rhonchi or crepitation. No use of accessory muscles of respiration.  CARDIOVASCULAR: S1, S2 normal. No murmurs, rubs, or gallops.  ABDOMEN: Soft, non-tender, non-distended. Bowel sounds present. No organomegaly or mass.   NEUROLOGIC: Moves all 4 extremities. PSYCHIATRIC: The patient is alert and oriented x 3.  SKIN: No obvious rash, lesion, or ulcer.   DATA REVIEW:   CBC Recent Labs  Lab 02/09/19 0455  WBC 5.5  HGB 8.7*  HCT 27.0*  PLT 131*    Chemistries  Recent Labs  Lab 02/08/19 0221 02/09/19 0455  NA 140 141  K 3.5 3.5  CL 109 112*  CO2 22 21*  GLUCOSE 125* 94  BUN 26* 17  CREATININE 1.67* 1.34*  CALCIUM 8.3* 8.5*  MG 2.0 2.0  AST 24  --   ALT 16  --   ALKPHOS 68  --   BILITOT 2.5*  --     Cardiac Enzymes No results for input(s): TROPONINI in the last 168 hours.  Microbiology Results  Results for orders placed or performed during the hospital encounter of 02/07/19  Culture, blood (routine x 2)     Status: Abnormal   Collection Time: 02/07/19  5:43 PM  Result Value Ref Range Status   Specimen Description   Final    BLOOD LEFT ANTECUBITAL Performed at [facility], [address], [place], [place] [address]    Special Requests   Final    BOTTLES DRAWN AEROBIC AND ANAEROBIC Blood Culture results may not be optimal due to an excessive volume of blood received in culture bottles Performed at [facility], [address] [place], [place] [address]    Culture  Setup Time   Final    GRAM NEGATIVE RODS ANAEROBIC BOTTLE ONLY CRITICAL RESULT CALLED TO, READ BACK BY AND VERIFIED WITH: CHARLES SHANLEVER 02/08/19 0809 KLW    Culture (A)  Final    ESCHERICHIA COLI SUSCEPTIBILITIES PERFORMED ON PREVIOUS CULTURE WITHIN THE LAST 5 DAYS. Performed at [facility], [address], [place], [place] [address]    Report Status 02/10/2019 FINAL  Final  Blood Culture ID Panel (Reflexed)     Status: Abnormal   Collection Time: 02/07/19  5:43 PM  Result Value Ref Range Status   Enterococcus species NOT DETECTED NOT DETECTED Final   Listeria monocytogenes NOT DETECTED NOT DETECTED Final   Staphylococcus species NOT DETECTED NOT DETECTED Final   Staphylococcus  aureus (BCID) NOT DETECTED NOT DETECTED Final   Streptococcus species NOT DETECTED NOT DETECTED Final   Streptococcus agalactiae NOT DETECTED NOT DETECTED Final   Streptococcus pneumoniae NOT DETECTED NOT DETECTED Final   Streptococcus pyogenes NOT DETECTED NOT DETECTED Final   Acinetobacter baumannii NOT DETECTED NOT DETECTED Final   Enterobacteriaceae species DETECTED (A) NOT DETECTED Final    Comment: Enterobacteriaceae represent a large family of gram-negative bacteria, not a single organism. CRITICAL RESULT CALLED TO, READ BACK BY AND VERIFIED WITH: CHARLES SHANLEVER 02/08/19 0809 KLW    Enterobacter cloacae complex NOT DETECTED NOT DETECTED Final   Escherichia coli DETECTED (A) NOT DETECTED Final    Comment: CRITICAL RESULT CALLED TO, READ BACK BY AND VERIFIED WITH: CHARLES SHANLEVER 02/08/19 0809 KLW    Klebsiella oxytoca NOT DETECTED NOT DETECTED Final   Klebsiella pneumoniae NOT DETECTED NOT DETECTED Final   Proteus species NOT DETECTED NOT DETECTED Final   Serratia marcescens NOT DETECTED  NOT DETECTED Final   Carbapenem resistance NOT DETECTED NOT DETECTED Final   Haemophilus influenzae NOT DETECTED NOT DETECTED Final   Neisseria meningitidis NOT DETECTED NOT DETECTED Final   Pseudomonas aeruginosa NOT DETECTED NOT DETECTED Final   Candida albicans NOT DETECTED NOT DETECTED Final   Candida glabrata NOT DETECTED NOT DETECTED Final   Candida krusei NOT DETECTED NOT DETECTED Final   Candida parapsilosis NOT DETECTED NOT DETECTED Final   Candida tropicalis NOT DETECTED NOT DETECTED Final    Comment: Performed at [facility], [address] [place], [place] [address]  Culture, blood (routine x 2)     Status: Abnormal (Preliminary result)   Collection Time: 02/07/19  5:48 PM  Result Value Ref Range Status   Specimen Description   Final    BLOOD RIGHT ANTECUBITAL Performed at [facility], [address] [place], [place] [address]    Special Requests   Final     BOTTLES DRAWN AEROBIC AND ANAEROBIC Blood Culture results may not be optimal due to an excessive volume of blood received in culture bottles Performed at [facility], [address] [place], [place] [address]    Culture  Setup Time   Final    IN BOTH AEROBIC AND ANAEROBIC BOTTLES GRAM NEGATIVE RODS CRITICAL RESULT CALLED TO, READ BACK BY AND VERIFIED WITH: Yves Dill 02/08/19 0809 KLW Performed at [facility], [address] [place], [place] [address]    Culture (A)  Final    ESCHERICHIA COLI Confirmed Extended Spectrum Beta-Lactamase Producer (ESBL).  In bloodstream infections from ESBL organisms, carbapenems are preferred over piperacillin/tazobactam. They are shown to have a lower risk of mortality.    Report Status PENDING  Incomplete   Organism ID, Bacteria ESCHERICHIA COLI  Final      Susceptibility   Escherichia coli - MIC*    AMPICILLIN >=32 RESISTANT Resistant     CEFAZOLIN >=64 RESISTANT Resistant     CEFEPIME RESISTANT Resistant     CEFTAZIDIME RESISTANT Resistant     CEFTRIAXONE >=64 RESISTANT Resistant     CIPROFLOXACIN <=0.25 SENSITIVE Sensitive     GENTAMICIN <=1 SENSITIVE Sensitive     IMIPENEM <=0.25 SENSITIVE Sensitive     TRIMETH/SULFA >=320 RESISTANT Resistant     AMPICILLIN/SULBACTAM >=32 RESISTANT Resistant     PIP/TAZO <=4 SENSITIVE Sensitive     Extended ESBL POSITIVE Resistant     * ESCHERICHIA COLI  Urine culture     Status: None   Collection Time: 02/07/19  5:56 PM  Result Value Ref Range Status   Specimen Description   Final    URINE, RANDOM Performed at [facility], [address], [place], [place] [address]    Special Requests   Final    NONE Performed at [facility], [address] [place], [place] [address]    Culture   Final    NO GROWTH Performed at [facility], [address], [place], [place] [address]    Report Status 02/09/2019 FINAL  Final  CSF culture with Stat gram  stain     Status: None (Preliminary result)   Collection Time: 02/07/19  7:15 PM  Result Value Ref Range Status   Specimen Description CSF  Final   Special Requests Immunocompromised  Final   Gram Stain   Final    NO ORGANISMS SEEN WBC SEEN RBCS SEEN Performed at [facility], [address] [place], [place] [address]    Culture  PENDING  Incomplete   Report Status PENDING  Incomplete    RADIOLOGY:  US Renal  Result Date: 02/09/2019 CLINICAL DATA:  Bacteremia EXAM: RENAL / URINARY TRACT ULTRASOUND COMPLETE COMPARISON:  None. FINDINGS: Right Kidney: Renal measurements: 11.3 x 5.4 x 6.0 cm = volume: 190 mL. Mild increased renal echogenicity. Prominent medullary pyramids. No focal abnormality or hydronephrosis. Left Kidney: Renal measurements: 10.7 x 5.1 x 5.5 cm = volume: 156 mL. Similar mild increased echogenicity and prominent medullary pyramids. No focal abnormality or hydronephrosis. Bladder: Appears normal for degree of bladder distention. IMPRESSION: Increased renal echogenicity suggesting medical renal disease. No acute finding or hydronephrosis by ultrasound. Electronically Signed   By: Judie Petit.  Shick M.D.   On: 02/09/2019 15:28    Follow up with PCP in 1 week.  Management plans discussed with the patient, family and they are in agreement.  CODE STATUS: Full Code Code Status History    Date Active Date Inactive Code Status Order ID Comments User Context   02/08/2019 0126 02/09/2019 1930 Full Code [id number]  Oralia Manis, MD Inpatient   09/04/2018 0824 09/05/2018 1516 Full Code [id number]  Arnaldo Natal, MD Inpatient      TOTAL TIME TAKING CARE OF THIS PATIENT ON DAY OF DISCHARGE: more than 36 minutes.   Ihor Austin M.D on 02/10/2019 at 10:47 AM  Between 7am to 6pm - Pager - [phone]  After 6pm go to [email or web address] - password [email or web address]  [facility] [place] [facility]  Office  [phone]  CC: Primary care physician; Patient, No Pcp Per  Note: This dictation was  prepared with Dragon dictation along with smaller phrase technology. Any transcriptional errors that result from this process are unintentional.

## 2019-02-10 NOTE — Progress Notes (Signed)
Left message with patient that a new prescription had been sent in for him to pick up at his pharmacy.    Sharin Mons, PharmD, BCPS, BCIDP Infectious Diseases Clinical Pharmacist Phone: [phone]

## 2019-02-11 LAB — CULTURE, BLOOD (ROUTINE X 2)

## 2019-02-11 LAB — HIV-1 RNA QUANT-NO REFLEX-BLD
HIV 1 RNA Quant: 675000 copies/mL
LOG10 HIV-1 RNA: 5.829 log10copy/mL

## 2019-02-16 LAB — CSF CULTURE W GRAM STAIN

## 2019-02-16 LAB — CSF CULTURE: Gram Stain: NONE SEEN

## 2019-03-10 LAB — FUNGUS CULTURE WITH STAIN

## 2019-03-10 LAB — FUNGUS CULTURE RESULT

## 2019-03-10 LAB — FUNGAL ORGANISM REFLEX

## 2020-10-30 IMAGING — CT CT HEAD WITHOUT CONTRAST
3 of 4 series · 14 of 47 positions shown, 16 images · non-contrast
Comparison: None.

CLINICAL DATA: Headache and vomiting since yesterday with
associated fevers. History of fungal meningitis.

EXAM:
CT HEAD WITHOUT CONTRAST
TECHNIQUE: Contiguous axial images were obtained from the base of the skull
through the vertex without intravenous contrast.

[Series 2: head wo · axial · 0.47mm/px · z∈[-171,-51]mm · 8 of 30 slices shown, 10 images]
[im 3/30  brain]
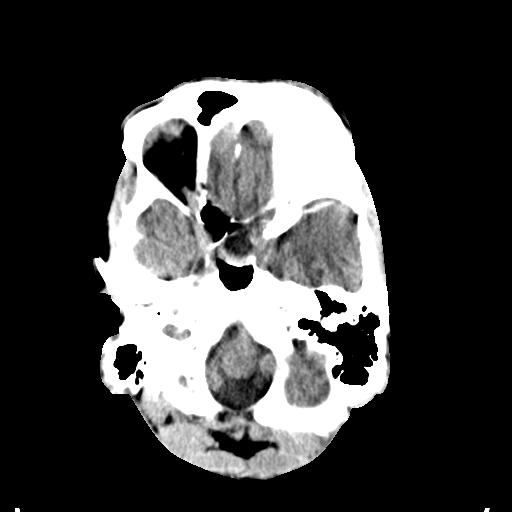
[im 3/30  bone]
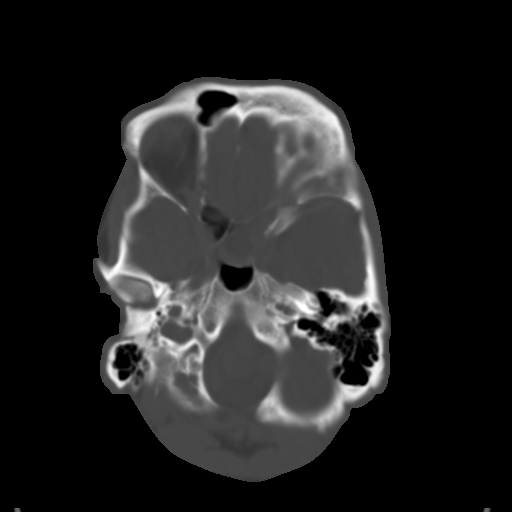
[im 6/30  brain]
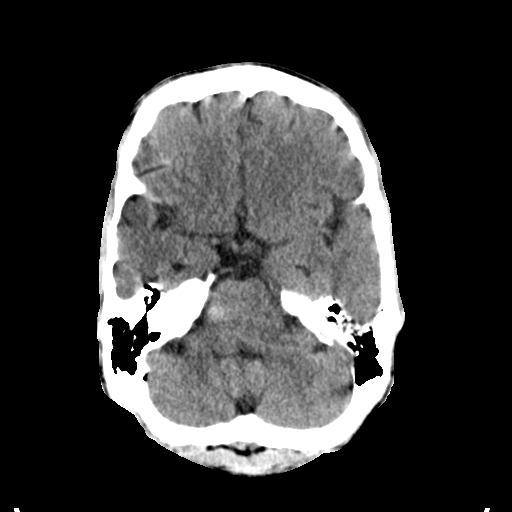
[im 10/30  brain]
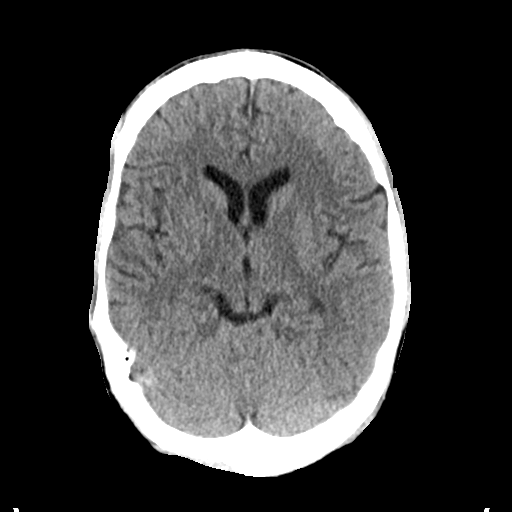
[im 13/30  brain]
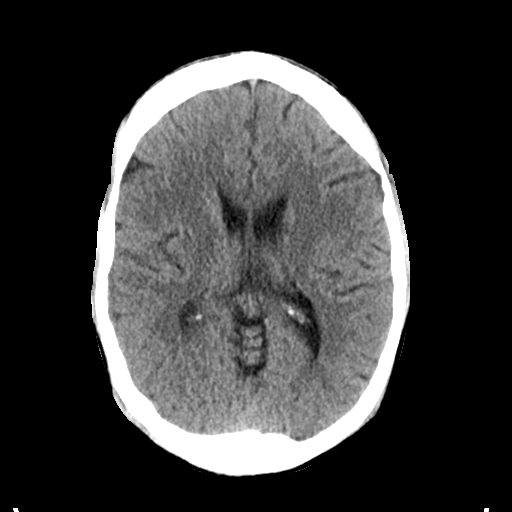
[im 17/30  brain]
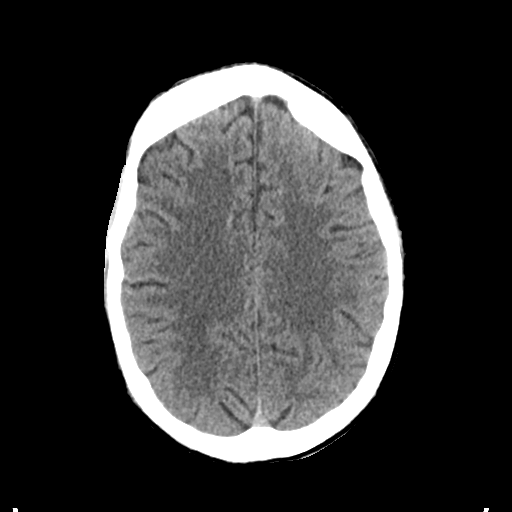
[im 17/30  bone]
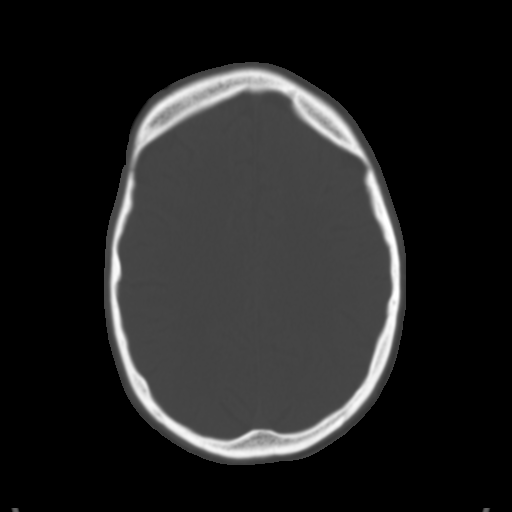
[im 20/30  brain]
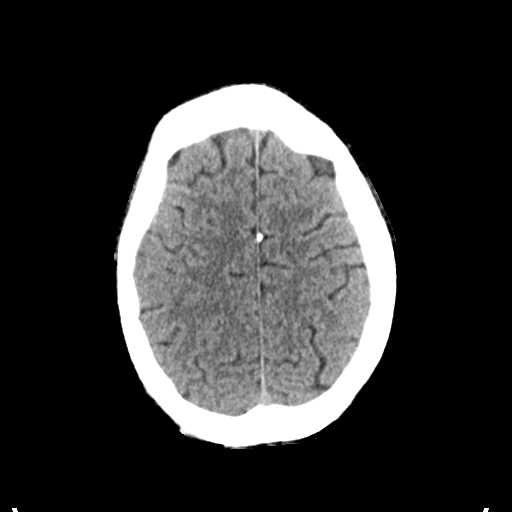
[im 24/30  brain]
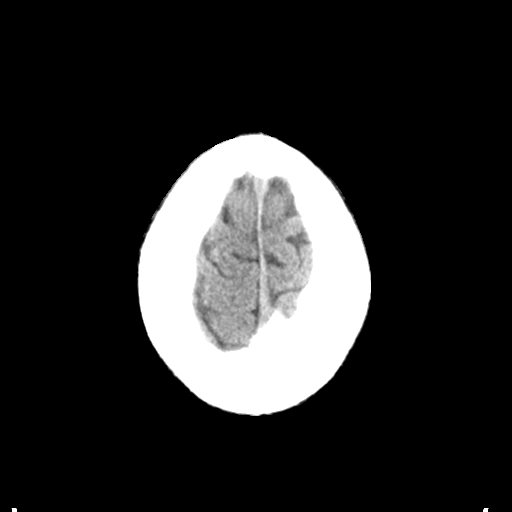
[im 27/30  brain]
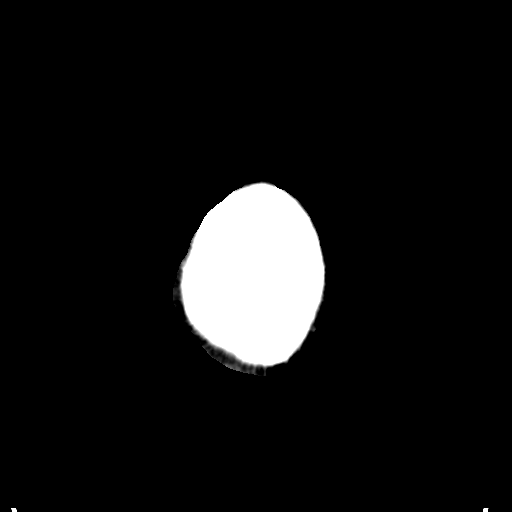

[Series 6: coronal soft tissue · coronal · 0.29mm/px · 3 of 68 slices shown]
[im 23/68  brain]
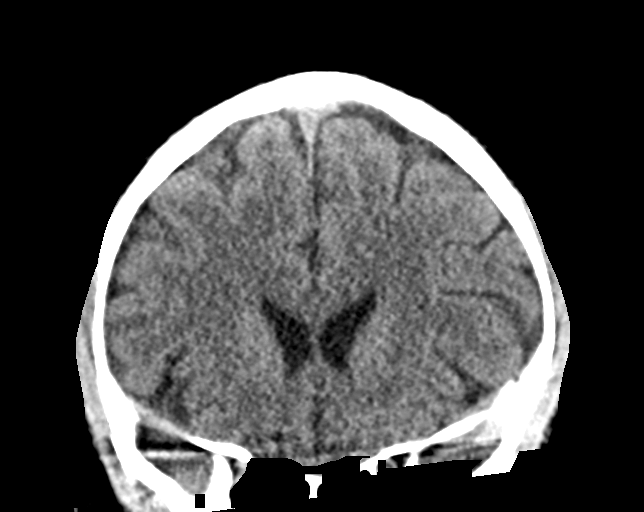
[im 30/68  brain]
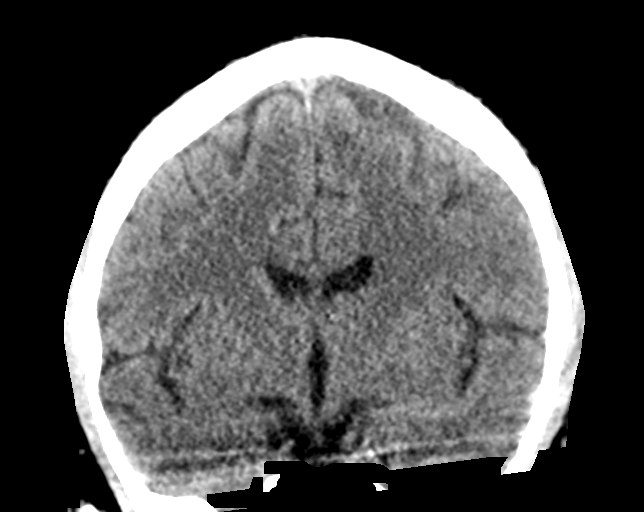
[im 38/68  brain]
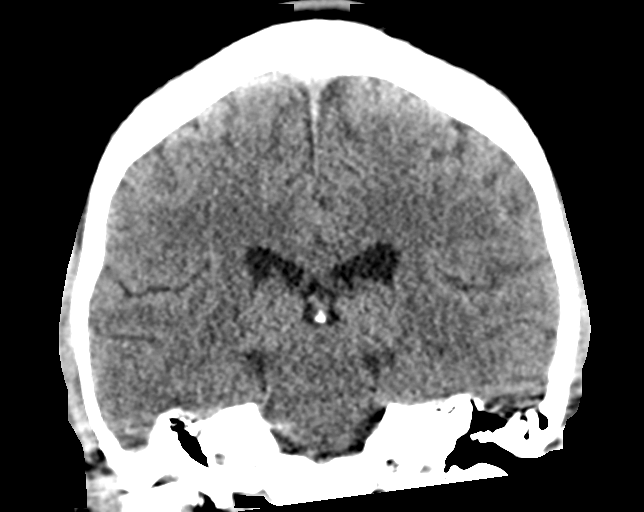

[Series 7: sagittal soft tissue · sagittal · 0.30mm/px · 3 of 52 slices shown]
[im 18/52  brain]
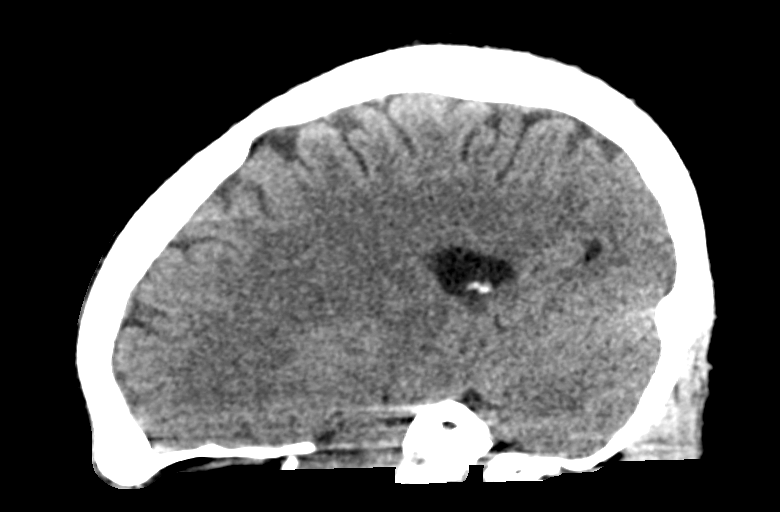
[im 26/52  brain]
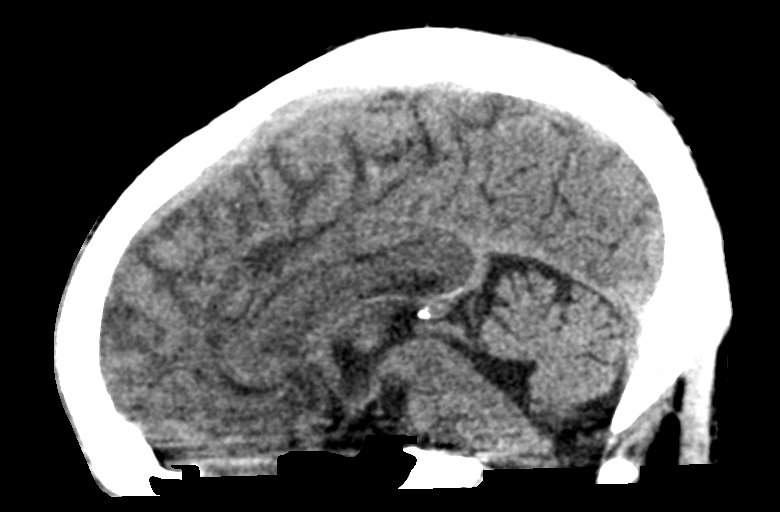
[im 35/52  brain]
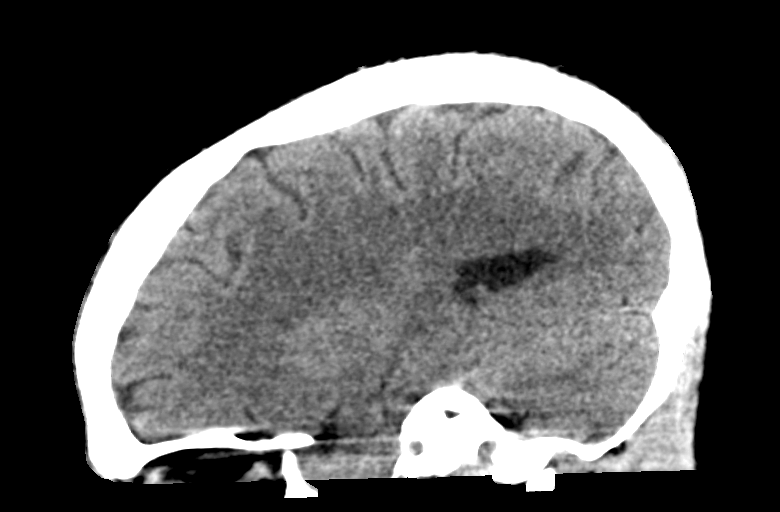

[14 of 47 positions shown; findings below may reference images not displayed]

FINDINGS: Brain: The brain shows a normal appearance without evidence of
malformation, atrophy, old or acute small or large vessel
infarction, mass lesion, hemorrhage, hydrocephalus or extra-axial
collection.

Vascular: No hyperdense vessel. No evidence of atherosclerotic
calcification.

Skull: Normal.  No traumatic finding.  No focal bone lesion.

Sinuses/Orbits: Sinuses are clear. Orbits appear normal. Mastoids
are clear.

Other: None significant
IMPRESSION: Normal head CT

## 2020-10-30 IMAGING — CR CHEST - 2 VIEW
2 series · 2 of 2 positions shown · non-contrast
Comparison: None.

CLINICAL DATA: Fever

EXAM:
CHEST - 2 VIEW

[chest pa]
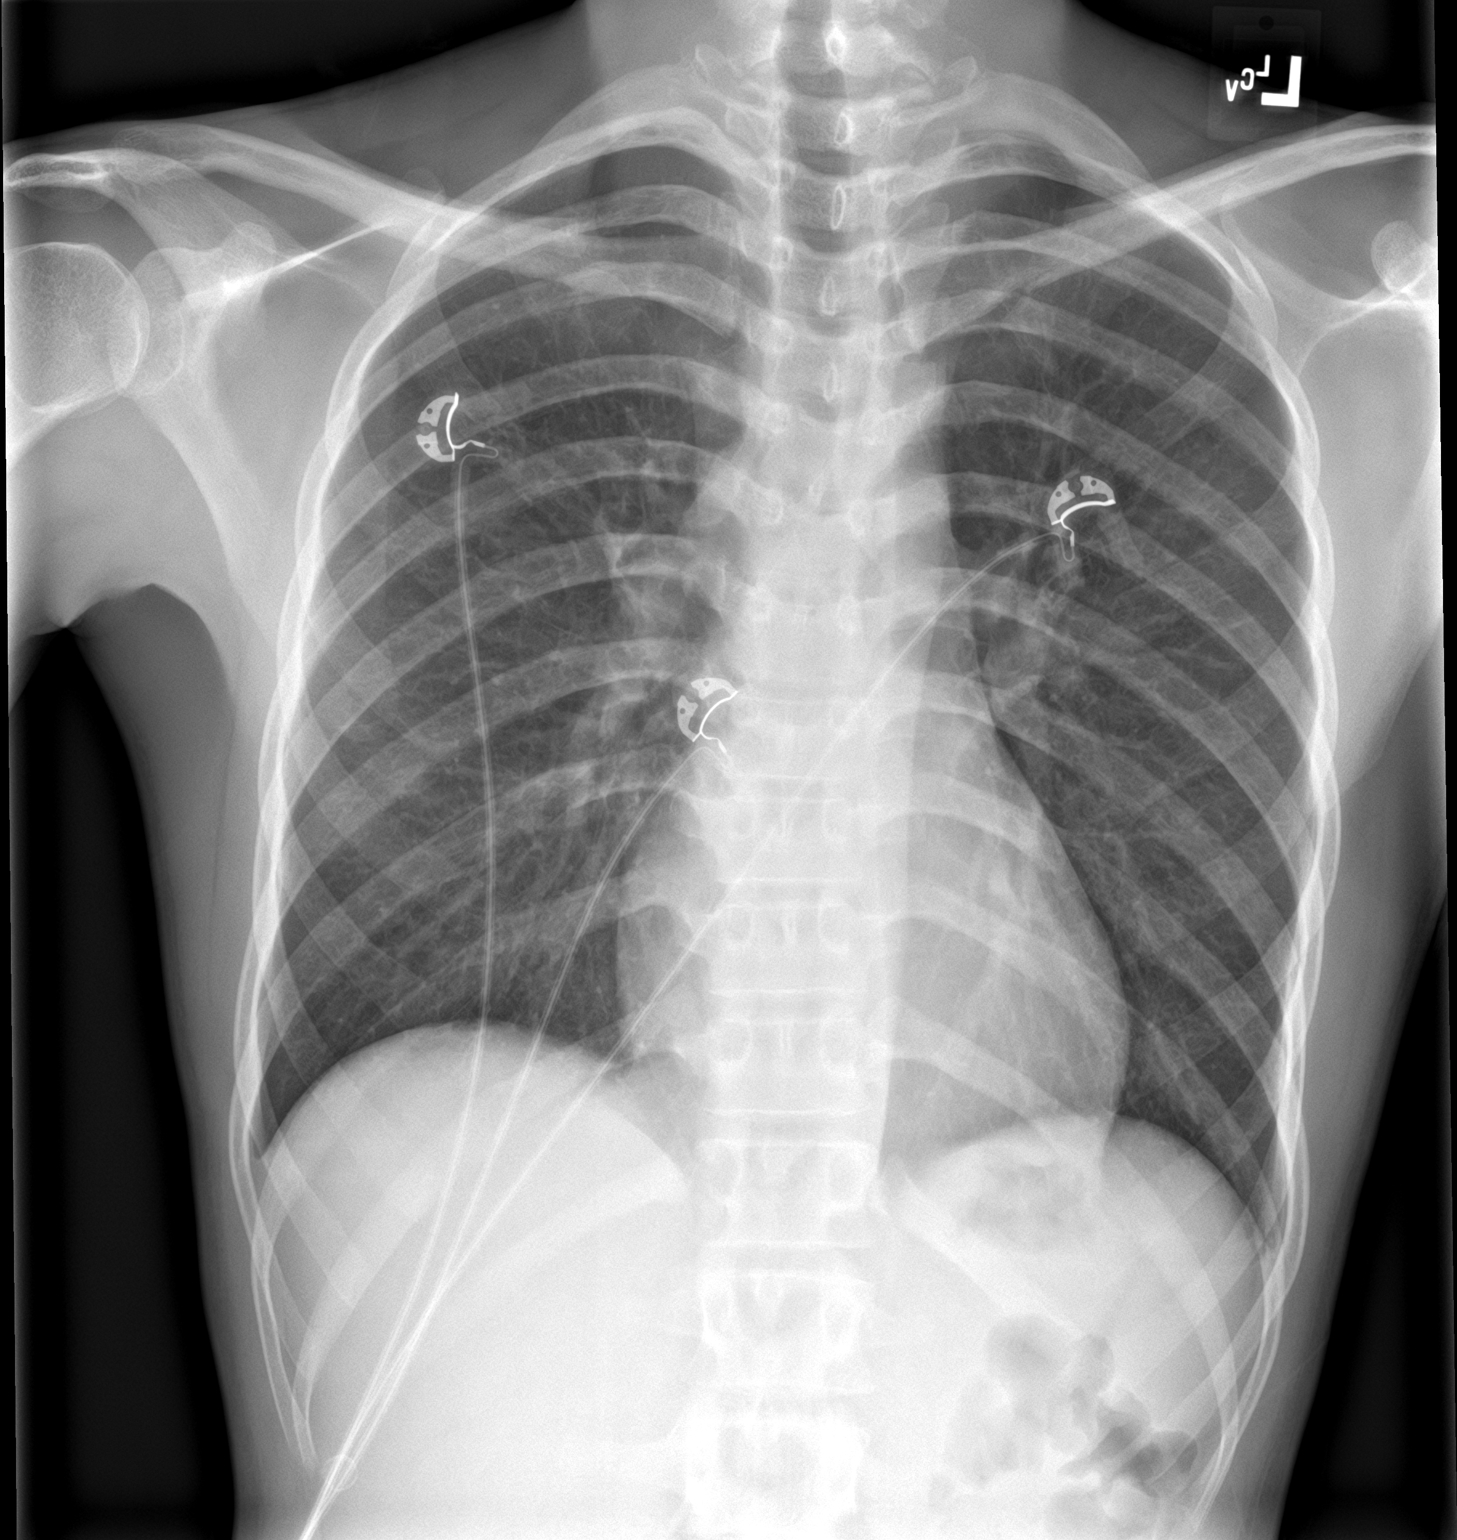

[chest lat]
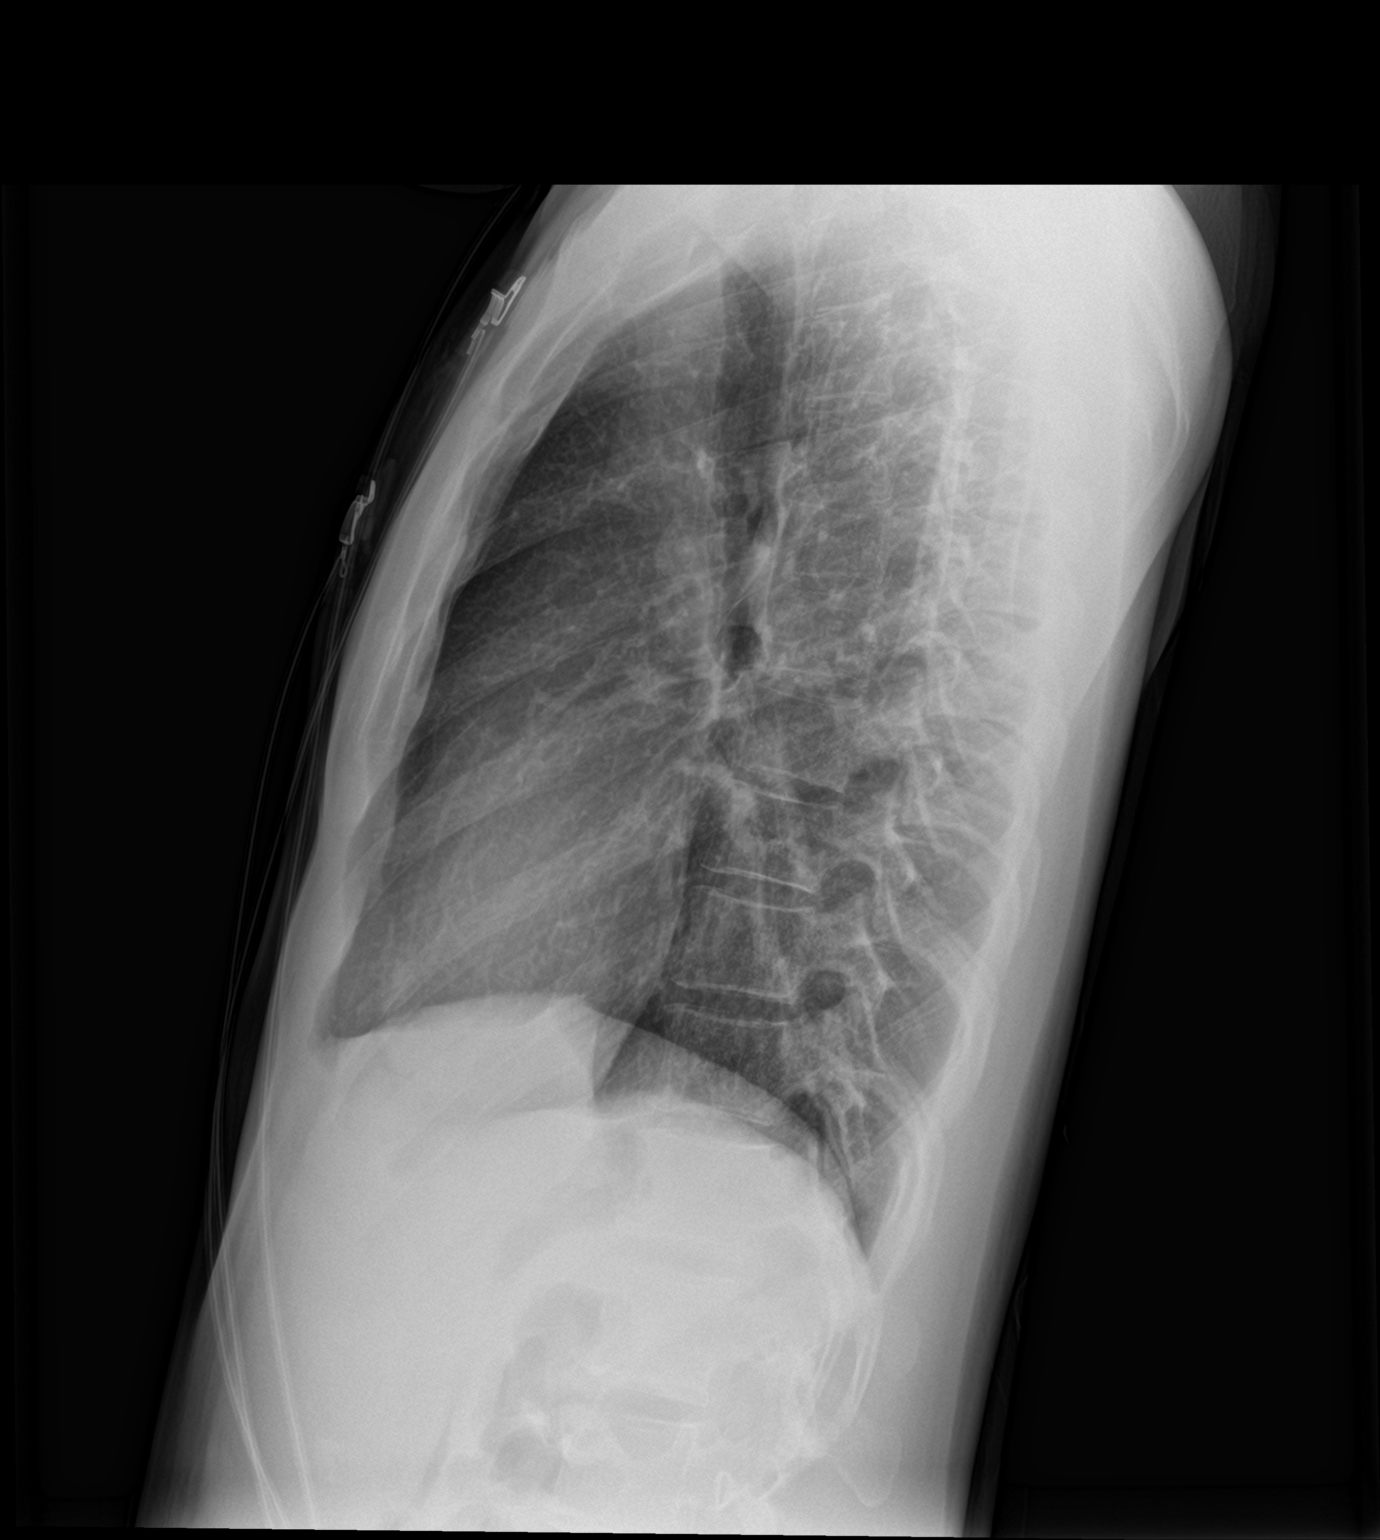

[2 of 2 positions shown; findings below may reference images not displayed]

FINDINGS: Lungs are clear. Heart size and pulmonary vascularity are normal. No
adenopathy. No bone lesions.
IMPRESSION: No edema or consolidation.

## 2020-11-01 IMAGING — US US RENAL
1 series · 14 of 25 positions shown · non-contrast
Comparison: None.

CLINICAL DATA: Bacteremia

EXAM:
RENAL / URINARY TRACT ULTRASOUND COMPLETE

[Series 1: us renal · 14 of 65 slices shown]
[im 1/65]
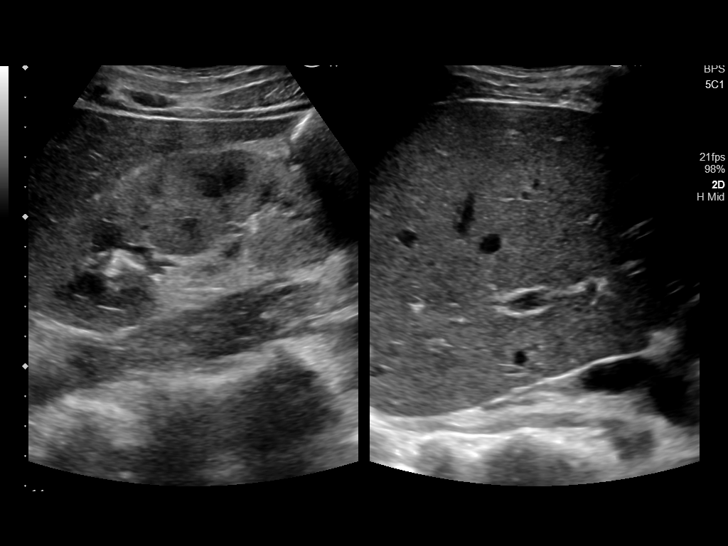
[im 6/65]
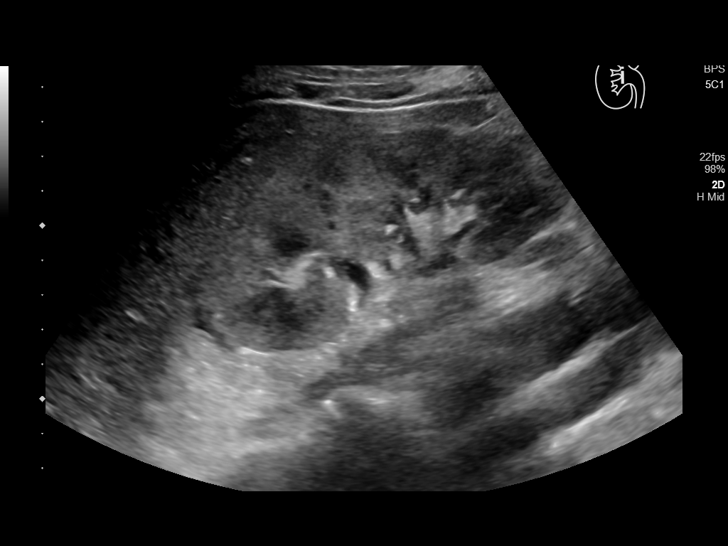
[im 11/65]
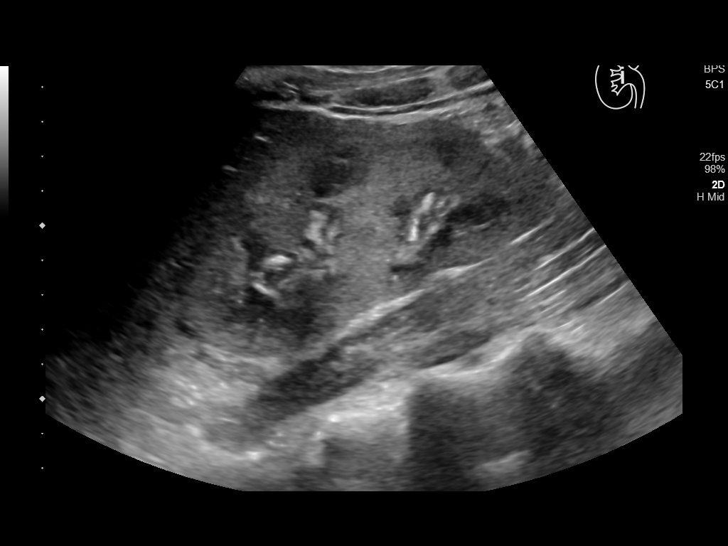
[im 17/65]
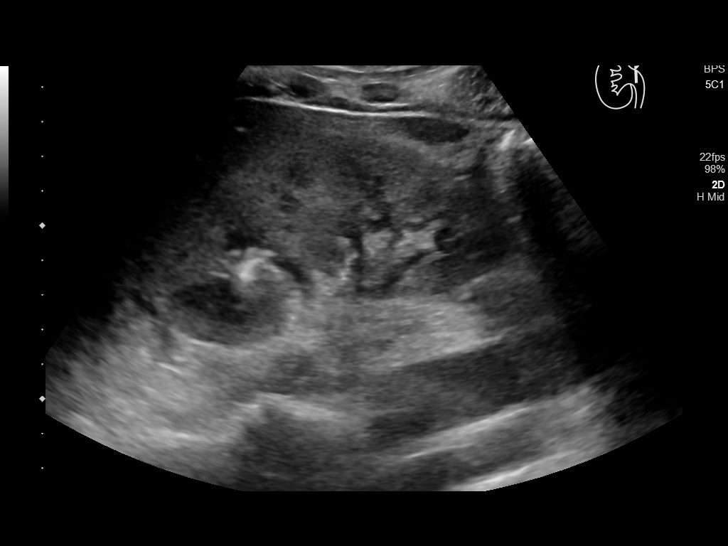
[im 22/65]
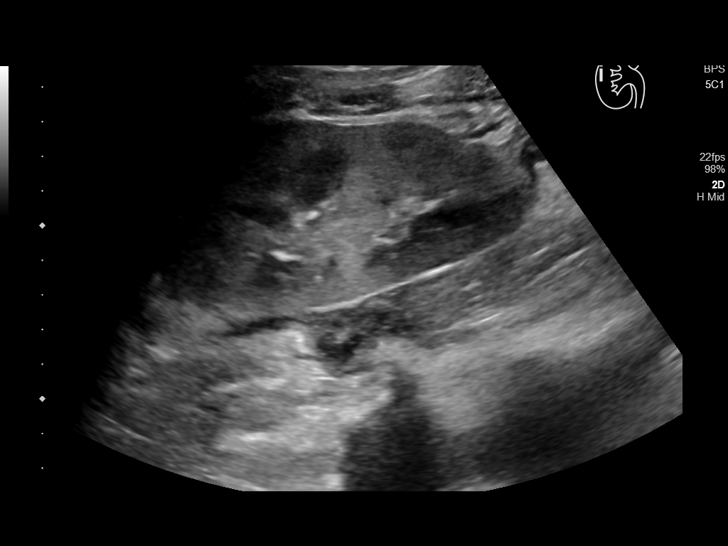
[im 25/65]
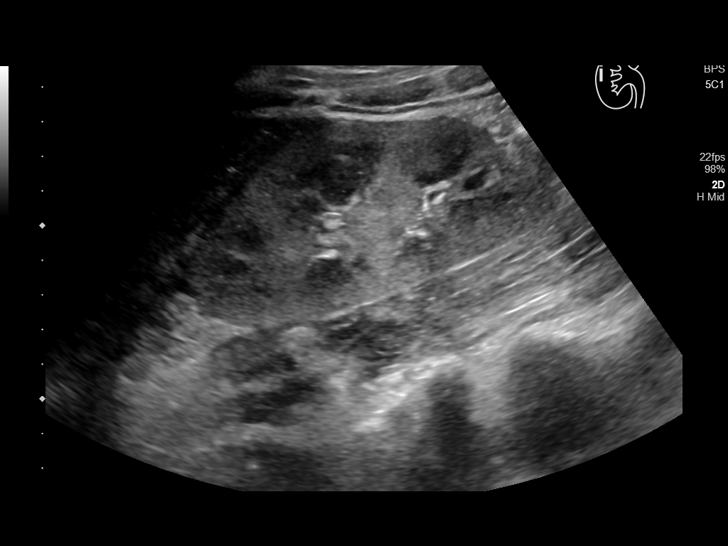
[im 30/65]
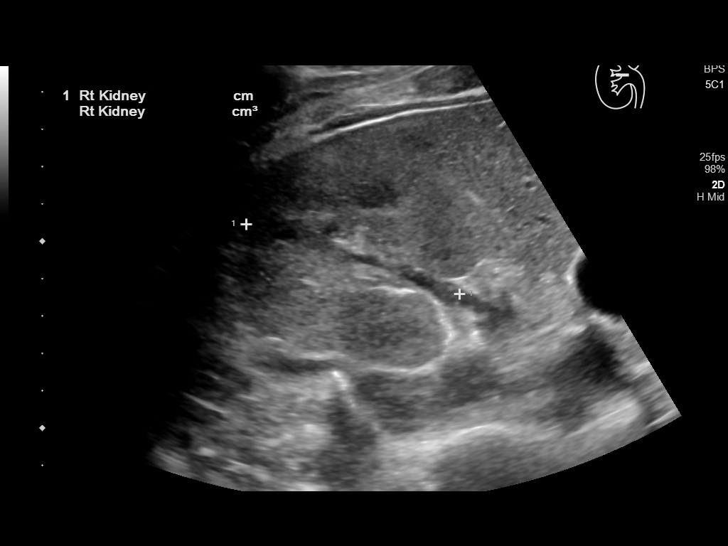
[im 35/65]
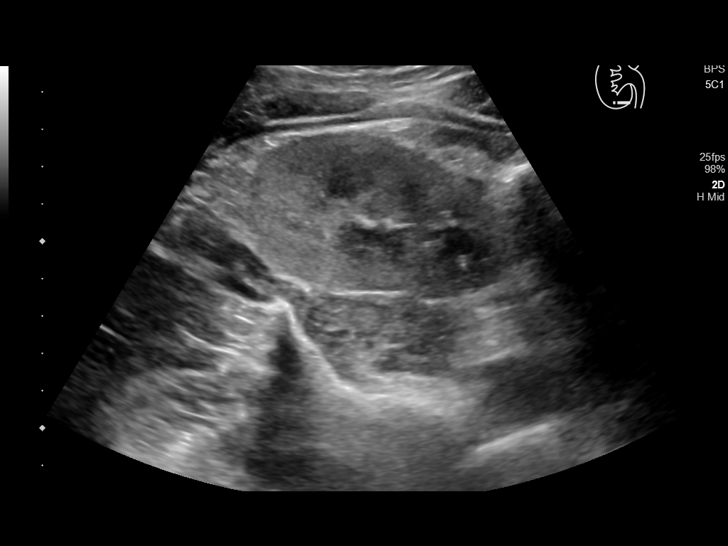
[im 41/65]
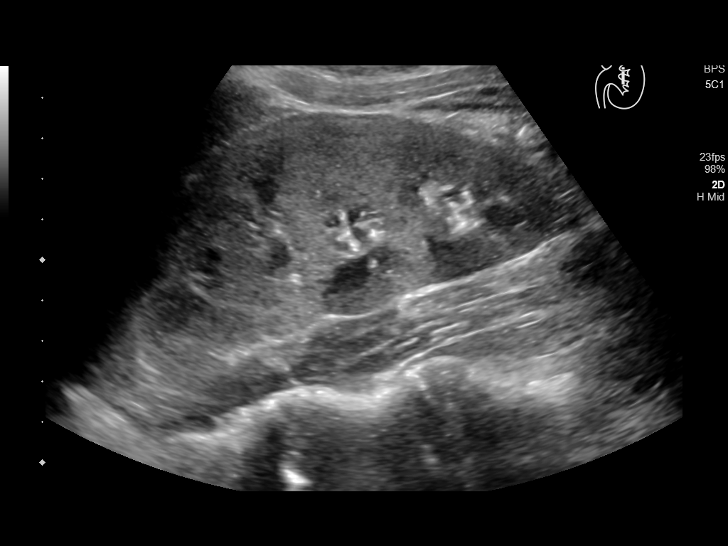
[im 43/65]
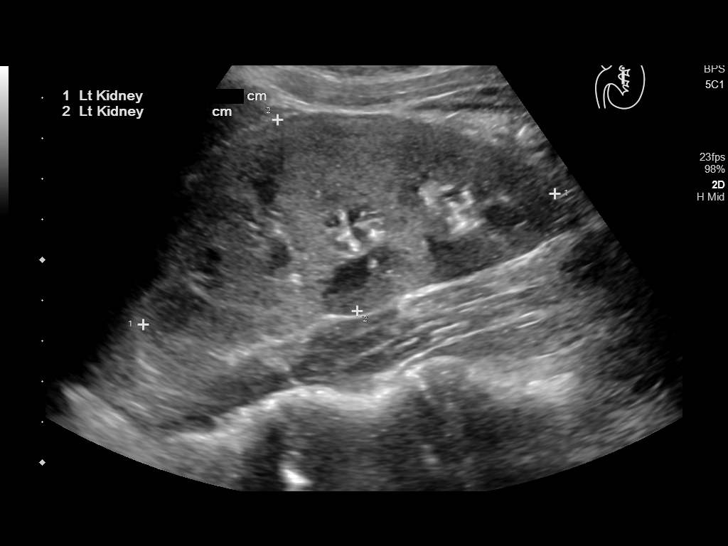
[im 49/65]
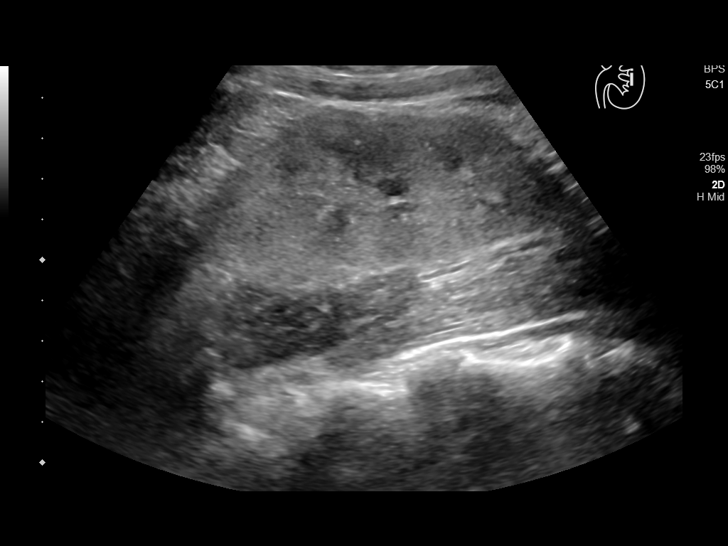
[im 54/65]
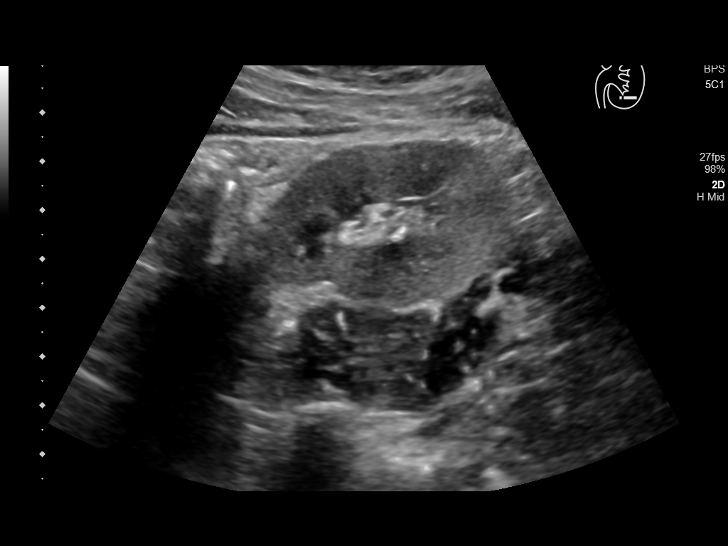
[im 59/65]
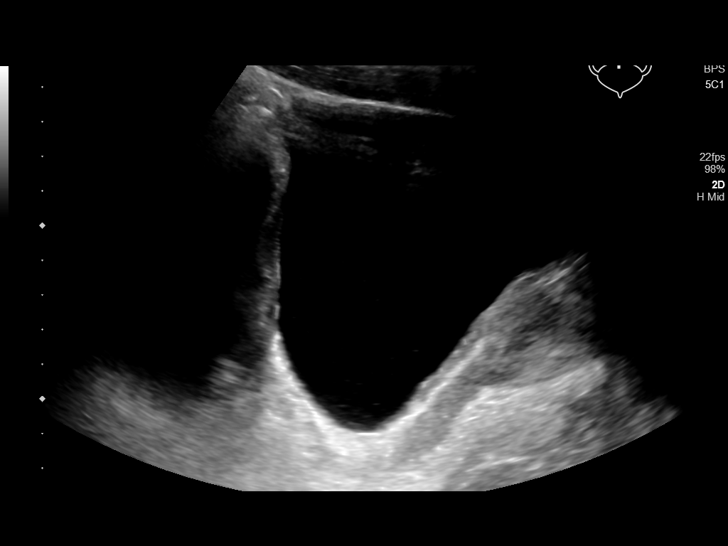
[im 65/65]
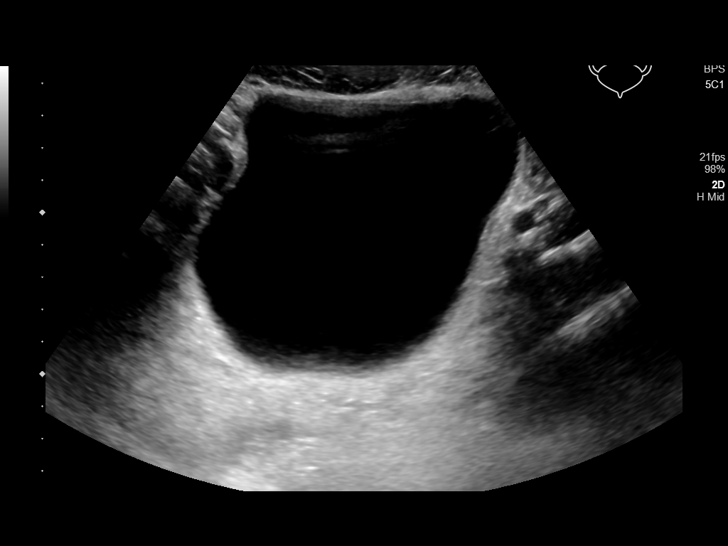

[14 of 25 positions shown; findings below may reference images not displayed]

FINDINGS: Right Kidney:

Renal measurements: 11.3 x 5.4 x 6.0 cm = volume: 190 mL. Mild
increased renal echogenicity. Prominent medullary pyramids. No focal
abnormality or hydronephrosis.

Left Kidney:

Renal measurements: 10.7 x 5.1 x 5.5 cm = volume: 156 mL. Similar
mild increased echogenicity and prominent medullary pyramids. No
focal abnormality or hydronephrosis.

Bladder:

Appears normal for degree of bladder distention.
IMPRESSION: Increased renal echogenicity suggesting medical renal disease. No
acute finding or hydronephrosis by ultrasound.

## 2020-11-06 ENCOUNTER — Other Ambulatory Visit: Payer: Self-pay

## 2020-11-06 ENCOUNTER — Emergency Department
Admission: EM | Admit: 2020-11-06 | Discharge: 2020-11-06 | Disposition: A | Discharge status: Home / Self Care | Payer: HRSA Program | Attending: Emergency Medicine | Admitting: Emergency Medicine

## 2020-11-06 ENCOUNTER — Encounter: Payer: Self-pay | Admitting: Emergency Medicine

## 2020-11-06 DIAGNOSIS — N183 Chronic kidney disease, stage 3 unspecified: Secondary | ICD-10-CM | POA: Diagnosis not present

## 2020-11-06 DIAGNOSIS — R112 Nausea with vomiting, unspecified: Secondary | ICD-10-CM | POA: Diagnosis present

## 2020-11-06 DIAGNOSIS — U071 COVID-19: Secondary | ICD-10-CM | POA: Diagnosis not present

## 2020-11-06 DIAGNOSIS — Z21 Asymptomatic human immunodeficiency virus [HIV] infection status: Secondary | ICD-10-CM | POA: Insufficient documentation

## 2020-11-06 LAB — RESP PANEL BY RT-PCR (FLU A&B, COVID) ARPGX2
Influenza A by PCR: NEGATIVE
Influenza B by PCR: NEGATIVE
SARS Coronavirus 2 by RT PCR: POSITIVE — AB

## 2020-11-06 MED ORDER — SODIUM CHLORIDE 0.9 % IV BOLUS
1000.0000 mL | Freq: Once | INTRAVENOUS | Status: AC
  Administered 2020-11-06: 1000 mL via INTRAVENOUS

## 2020-11-06 MED ORDER — ONDANSETRON HCL 8 MG PO TABS: 8.0000 mg | ORAL_TABLET | Freq: Three times a day (TID) | ORAL | 0 refills | Status: AC | PRN

## 2020-11-06 MED ORDER — PSEUDOEPH-BROMPHEN-DM 30-2-10 MG/5ML PO SYRP: 5.0000 mL | ORAL_SOLUTION | Freq: Four times a day (QID) | ORAL | 0 refills | Status: AC | PRN

## 2020-11-06 MED ORDER — KETOROLAC TROMETHAMINE 30 MG/ML IJ SOLN
30.0000 mg | Freq: Once | INTRAMUSCULAR | Status: AC
  Administered 2020-11-06: 30 mg via INTRAVENOUS
  Filled 2020-11-06: qty 1

## 2020-11-06 MED ORDER — ONDANSETRON HCL 4 MG/2ML IJ SOLN
4.0000 mg | Freq: Once | INTRAMUSCULAR | Status: AC
  Administered 2020-11-06: 4 mg via INTRAVENOUS
  Filled 2020-11-06: qty 2

## 2020-11-06 NOTE — ED Triage Notes (Signed)
Pt in w/reports of n/v, bodyaches, wkns and poor appetite x few days. Says he took Covid test last HS and it was negative. Also reports being w/o Viktarvy medication x 2 wks, just restarted up to his 3rd dose last night. No fevers, sob noted, but does report chills.

## 2020-11-06 NOTE — ED Provider Notes (Signed)
[facility] Emergency Department Provider Note   ____________________________________________   Event Date/Time   First MD Initiated Contact with Patient 11/06/20 [phone]     (approximate)  I have reviewed the triage vital signs and the nursing notes.   HISTORY  Chief Complaint No chief complaint on file.    HPI David Lopez is a 30 y.o. male patient presents with nausea, vomiting, body aches, poor appetite, and weakness for 3 days.  Patient states took a Covid test last evening which he bought over-the-counter and it was negative.  Patient recently started started taking ViKtaravy 2 weeks.  Patient stated there was a week delay before taking his third dose which he took last night.  Denies cough or dyspnea.  No recent travel or known contact with COVID-19.  Patient did not take the Covid vaccine or the flu shot for this season.         Past Medical History:  Diagnosis Date  . HIV (human immunodeficiency virus infection) (HCC)   . 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 CKD (chronic kidney disease), stage III (HCC) 02/07/2019  . HIV (human immunodeficiency virus infection) (HCC) 02/07/2019  . Sepsis (HCC) 02/07/2019  . Meningitis 02/07/2019  . Zoster 09/04/2018    History reviewed. No pertinent surgical history.  Prior to Admission medications   Medication Sig Start Date End Date Taking? Authorizing Provider  brompheniramine-pseudoephedrine-DM 30-2-10 MG/5ML syrup Take 5 mLs by mouth 4 (four) times daily as needed. 11/06/20  Yes Joni Reining, PA-C  ondansetron (ZOFRAN) 8 MG tablet Take 1 tablet (8 mg total) by mouth every 8 (eight) hours as needed for nausea or vomiting. 11/06/20  Yes Nona Dell K, PA-C  BIKTARVY 50-200-25 MG TABS tablet Take 1 tablet by mouth daily. 08/31/18   [provider]    Allergies Patient has no known allergies.  Family History  Problem  Relation Age of Onset  . Diabetes Mellitus II Paternal Grandmother   . CAD Neg Hx     Social History Social History   Tobacco Use  . Smoking status: Never Smoker  . Smokeless tobacco: Never Used  Substance Use Topics  . Alcohol use: Never    Review of Systems Constitutional: No fever/chills Eyes: No visual changes. ENT: No sore throat. Cardiovascular: Denies chest pain. Respiratory: Denies shortness of breath. Gastrointestinal: No abdominal pain.  No nausea, no vomiting.  No diarrhea.  No constipation. Genitourinary: Negative for dysuria. Musculoskeletal: Negative for back pain. Skin: Negative for rash. Neurological: Negative for headaches, focal weakness or numbness. Endocrine:  Tonic kidney disease Allergic/Immunilogical: HIV ____________________________________________   PHYSICAL EXAM:  VITAL SIGNS: ED Triage Vitals  Enc Vitals Group     BP 11/06/20 0730 (!) 167/107     Pulse Rate 11/06/20 0730 (!) 120     Resp 11/06/20 0730 16     Temp 11/06/20 0730 98.7 F (37.1 C)     Temp Source 11/06/20 0730 Oral     SpO2 11/06/20 0730 99 %     Weight 11/06/20 0732 120 lb (54.4 kg)     Height 11/06/20 0732 5\' 11"  (1.803 m)     Head Circumference --      Peak Flow --      Pain Score --      Pain Loc --      Pain Edu? --      Excl. in GC? --  Constitutional: Alert and oriented. Well appearing and in no acute distress.  Appears malaise Eyes: Conjunctivae are normal. PERRL. EOMI. Head: Atraumatic. Nose: No congestion/rhinnorhea. Mouth/Throat: Mucous membranes are moist.  Oropharynx non-erythematous. Neck: No stridor.  Hematological/Lymphatic/Immunilogical: No cervical lymphadenopathy. Cardiovascular: Tachycardia, regular rhythm. Grossly normal heart sounds.  Good peripheral circulation.  Elevated blood pressure Respiratory: Normal respiratory effort.  No retractions. Lungs CTAB. Gastrointestinal: Soft and nontender. No distention. No abdominal bruits. No CVA  tenderness. Genitourinary: Deferred Musculoskeletal: No lower extremity tenderness nor edema.  No joint effusions. Neurologic:  Normal speech and language. No gross focal neurologic deficits are appreciated. No gait instability. Skin:  Skin is warm, dry and intact. No rash noted. Psychiatric: Mood and affect are normal. Speech and behavior are normal.  ____________________________________________   LABS (all labs ordered are listed, but only abnormal results are displayed)  Labs Reviewed  RESP PANEL BY RT-PCR (FLU A&B, COVID) ARPGX2 - Abnormal; Notable for the following components:      Result Value   SARS Coronavirus 2 by RT PCR POSITIVE (*)    All other components within normal limits   ____________________________________________  EKG   ____________________________________________  RADIOLOGY I, Joni Reining, personally viewed and evaluated these images (plain radiographs) as part of my medical decision making, as well as reviewing the written report by the radiologist.  ED MD interpretation:    Official radiology report(s): No results found.  ____________________________________________   PROCEDURES  Procedure(s) performed (including Critical Care):  Procedures   ____________________________________________   INITIAL IMPRESSION / ASSESSMENT AND PLAN / ED COURSE  As part of my medical decision making, I reviewed the following data within the electronic MEDICAL RECORD NUMBER         Patient presents for body ache, weakness and decreased appetite for few days.  Patient test positive COVID-19 today.  Patient given discharge care instruction.  Patient advised self quarantine for 10 days.  Take medication as directed.  Recommend Covid vaccine and flu shot after quarantine.      ____________________________________________   FINAL CLINICAL IMPRESSION(S) / ED DIAGNOSES  Final diagnoses:  COVID-19     ED Discharge Orders         Ordered    ondansetron  (ZOFRAN) 8 MG tablet  Every 8 hours PRN        11/06/20 1200    brompheniramine-pseudoephedrine-DM 30-2-10 MG/5ML syrup  4 times daily PRN        11/06/20 1200          *Please note:  David Lopez was evaluated in Emergency Department on 11/06/2020 for the symptoms described in the history of present illness. He was evaluated in the context of the global COVID-19 pandemic, which necessitated consideration that the patient might be at risk for infection with the SARS-CoV-2 virus that causes COVID-19. Institutional protocols and algorithms that pertain to the evaluation of patients at risk for COVID-19 are in a state of rapid change based on information released by regulatory bodies including the CDC and federal and state organizations. These policies and algorithms were followed during the patient's care in the ED.  Some ED evaluations and interventions may be delayed as a result of limited staffing during and the pandemic.*   Note:  This document was prepared using Dragon voice recognition software and may include unintentional dictation errors.    Joni Reining, PA-C 11/06/20 1203    Shaune Pollack, MD 11/07/20 [phone]

## 2020-11-06 NOTE — ED Notes (Addendum)
Patient reports cough/congestion with nausea and vomiting for the past several days. States this morning he was mostly dry heaving due to lack of having eaten much. Patient states he has been off his antivirals for 2-3 weeks but is now back on them.  Patient reports history of meningitis in the past.  Patient also reports multiple Covid contacts recently.

## 2020-11-06 NOTE — Discharge Instructions (Addendum)
Your test positive for COVID-19.  Follow discharge care instructions.  Advised to self quarantine for 10 days.  Take medication as directed.  Advised extra strength Tylenol as needed for body aches and fever.

## 2021-03-27 ENCOUNTER — Emergency Department
Admission: EM | Admit: 2021-03-27 | Discharge: 2021-03-27 | Disposition: A | Discharge status: Home / Self Care | Payer: Medicaid Other | Attending: Emergency Medicine | Admitting: Emergency Medicine

## 2021-03-27 ENCOUNTER — Encounter: Payer: Self-pay | Admitting: Emergency Medicine

## 2021-03-27 ENCOUNTER — Other Ambulatory Visit: Payer: Self-pay

## 2021-03-27 DIAGNOSIS — L42 Pityriasis rosea: Secondary | ICD-10-CM | POA: Insufficient documentation

## 2021-03-27 DIAGNOSIS — R21 Rash and other nonspecific skin eruption: Secondary | ICD-10-CM

## 2021-03-27 DIAGNOSIS — Z21 Asymptomatic human immunodeficiency virus [HIV] infection status: Secondary | ICD-10-CM | POA: Insufficient documentation

## 2021-03-27 DIAGNOSIS — N183 Chronic kidney disease, stage 3 unspecified: Secondary | ICD-10-CM | POA: Insufficient documentation

## 2021-03-27 MED ORDER — FAMOTIDINE 20 MG PO TABS
40.0000 mg | ORAL_TABLET | Freq: Once | ORAL | Status: AC
  Administered 2021-03-27: 40 mg via ORAL
  Filled 2021-03-27: qty 2

## 2021-03-27 MED ORDER — DIPHENHYDRAMINE HCL 25 MG PO CAPS
50.0000 mg | ORAL_CAPSULE | Freq: Once | ORAL | Status: AC
  Administered 2021-03-27: 50 mg via ORAL
  Filled 2021-03-27: qty 2

## 2021-03-27 NOTE — ED Triage Notes (Signed)
C/O rash to body.  Started on Sunday.

## 2021-03-27 NOTE — Discharge Instructions (Signed)
Please use Pepcid (also called Famotidine) 40mg  twice daily for the next week. You may also take 50 mg of Benadryl every 6-12 hours. Please use caution when driving or operating machinery with the Benadryl as it may make you sleepy. Please continue the Bactrim as previously prescribed for infection prevention. Follow up with Primary Care or return to ER with any worsening of symptoms.

## 2021-03-27 NOTE — ED Provider Notes (Signed)
[facility] Emergency Department Provider Note  ____________________________________________   Event Date/Time   First MD Initiated Contact with Patient 03/27/21 1914     (approximate)  I have reviewed the triage vital signs and the nursing notes.   HISTORY  Chief Complaint Rash  HPI David Lopez is a 30 y.o. male who presents to the emergency department for evaluation of itchy rash.  Patient states that on Sunday he first noticed an area of bumps on his right leg that were itchy.  He then developed spreading of this rash over the next several days to now be diffuse.  He denies any fevers.  He does report that he recently started back on his Biktarvy for his known HIV but notes he had a 2-week lapse from the medication a few weeks ago.  He has overall been on the medication for approximately a year with no rashes prior to this.  He does report starting a new body soap on Saturday.  He denies any new sexual contacts.       Past Medical History:  Diagnosis Date  . HIV (human immunodeficiency virus infection) (HCC)   . 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 CKD (chronic kidney disease), stage III (HCC) 02/07/2019  . HIV (human immunodeficiency virus infection) (HCC) 02/07/2019  . Sepsis (HCC) 02/07/2019  . Meningitis 02/07/2019  . Zoster 09/04/2018    History reviewed. No pertinent surgical history.  Prior to Admission medications   Medication Sig Start Date End Date Taking? Authorizing Provider  BIKTARVY 50-200-25 MG TABS tablet Take 1 tablet by mouth daily. 08/31/18   [provider]  brompheniramine-pseudoephedrine-DM 30-2-10 MG/5ML syrup Take 5 mLs by mouth 4 (four) times daily as needed. 11/06/20   Joni Reining, PA-C  ondansetron (ZOFRAN) 8 MG tablet Take 1 tablet (8 mg total) by mouth every 8 (eight) hours as needed for nausea or vomiting. 11/06/20   Joni Reining, PA-C     Allergies Patient has no known allergies.  Family History  Problem Relation Age of Onset  . Diabetes Mellitus II Paternal Grandmother   . CAD Neg Hx     Social History Social History   Tobacco Use  . Smoking status: Never Smoker  . Smokeless tobacco: Never Used  Substance Use Topics  . Alcohol use: Never    Review of Systems Constitutional: No fever/chills Eyes: No visual changes. ENT: No sore throat. Cardiovascular: Denies chest pain. Respiratory: Denies shortness of breath. Gastrointestinal: No abdominal pain.  No nausea, no vomiting.  No diarrhea.  No constipation. Genitourinary: Negative for dysuria. Musculoskeletal: Negative for back pain. Skin: + Rash Neurological: Negative for headaches, focal weakness or numbness.  ____________________________________________   PHYSICAL EXAM:  VITAL SIGNS: ED Triage Vitals  Enc Vitals Group     BP 03/27/21 1810 140/87     Pulse Rate 03/27/21 1810 (!) 107     Resp 03/27/21 1810 17     Temp 03/27/21 1810 99 F (37.2 C)     Temp Source 03/27/21 1810 Oral     SpO2 03/27/21 1810 (!) 85 %     Weight 03/27/21 1801 135 lb (61.2 kg)     Height 03/27/21 1801 5\' 11"  (1.803 m)     Head Circumference --      Peak Flow --      Pain Score 03/27/21 1801 0     Pain Loc --  Pain Edu? --      Excl. in GC? --    Constitutional: Alert and oriented. Well appearing and in no acute distress. Eyes: Conjunctivae are normal. PERRL. EOMI. Head: Atraumatic. Nose: No congestion/rhinnorhea. Mouth/Throat: Mucous membranes are moist.  Oropharynx non-erythematous. Neck: No stridor.  Cardiovascular: Normal rate, regular rhythm. Grossly normal heart sounds.  Good peripheral circulation. Respiratory: Normal respiratory effort.  No retractions. Lungs CTAB. Gastrointestinal: Soft and nontender. No distention. No abdominal bruits. No CVA tenderness. Musculoskeletal: No lower extremity tenderness nor edema.  No joint effusions. Neurologic:   Normal speech and language. No gross focal neurologic deficits are appreciated. No gait instability. Skin: There is a diffuse erythematous rash that is primarily papular in circular raised lesions across the body.  It spares the palms and soles of the feet.  There is one area on the posterior trunk that seems more clustered, possibly as a herald patch.  The lesions on the legs have multiple excoriations.  No blistering, drainage or purulence noted. Psychiatric: Mood and affect are normal. Speech and behavior are normal.  ____________________________________________   INITIAL IMPRESSION / ASSESSMENT AND PLAN / ED COURSE  As part of my medical decision making, I reviewed the following data within the electronic MEDICAL RECORD NUMBER Nursing notes reviewed and incorporated and Notes from prior ED visits        Patient is a 30 year old male with known history of HIV who presents to the emergency department for evaluation of rash that has been progressing over the last 3 to 4 days.  He denies any new medications, but recently did restart his Biktarvy after a 2-week hiatus from the medication.  He is using a new body soap, denies any other new lotions or detergents.  On physical exam there is 1 area of the trunk that could be a herald patch representing psoriasis rosea, however the patient reports the rash began on the extremities which would be less likely.  We will treat with Pepcid and Benadryl for the patient's itching and erythema.  The patient is already covered with Bactrim for prevention of infection, which I encouraged the patient to stay on given his excoriations.  Return precautions were discussed, patient stable this time for outpatient management.      ____________________________________________   FINAL CLINICAL IMPRESSION(S) / ED DIAGNOSES  Final diagnoses:  Rash  Pityriasis rosea     ED Discharge Orders    None      *Please note:  David Lopez was evaluated in Emergency  Department on 03/27/2021 for the symptoms described in the history of present illness. He was evaluated in the context of the global COVID-19 pandemic, which necessitated consideration that the patient might be at risk for infection with the SARS-CoV-2 virus that causes COVID-19. Institutional protocols and algorithms that pertain to the evaluation of patients at risk for COVID-19 are in a state of rapid change based on information released by regulatory bodies including the CDC and federal and state organizations. These policies and algorithms were followed during the patient's care in the ED.  Some ED evaluations and interventions may be delayed as a result of limited staffing during and the pandemic.*   Note:  This document was prepared using Dragon voice recognition software and may include unintentional dictation errors.   Lucy Chris, PA 03/27/21 2344    Merwyn Katos, MD 03/28/21 [phone]

## 2021-03-27 NOTE — ED Notes (Signed)
Pt c/o itchy rash on arms, legs, abdomen, and back that started sunday. Red raised rash noted.
